# Patient Record
Sex: Female | Born: 1990 | Race: White | Hispanic: No | Marital: Married | Smoking: Never smoker
Health system: Southern US, Community
[De-identification: ages and names within clinical notes are randomized; demographics above are authoritative.]

## PROBLEM LIST (undated history)

## (undated) DIAGNOSIS — N189 Chronic kidney disease, unspecified: Secondary | ICD-10-CM

## (undated) DIAGNOSIS — Z9889 Other specified postprocedural states: Secondary | ICD-10-CM

## (undated) DIAGNOSIS — K8020 Calculus of gallbladder without cholecystitis without obstruction: Secondary | ICD-10-CM

## (undated) DIAGNOSIS — G540 Brachial plexus disorders: Secondary | ICD-10-CM

## (undated) DIAGNOSIS — Z87442 Personal history of urinary calculi: Secondary | ICD-10-CM

## (undated) DIAGNOSIS — R519 Headache, unspecified: Secondary | ICD-10-CM

## (undated) DIAGNOSIS — R112 Nausea with vomiting, unspecified: Secondary | ICD-10-CM

## (undated) HISTORY — DX: Calculus of gallbladder without cholecystitis without obstruction: K80.20

## (undated) HISTORY — DX: Headache, unspecified: R51.9

## (undated) HISTORY — PX: FOOT SURGERY: SHX648

## (undated) HISTORY — PX: WISDOM TOOTH EXTRACTION: SHX21

## (undated) HISTORY — DX: Chronic kidney disease, unspecified: N18.9

## (undated) HISTORY — PX: KIDNEY STONE SURGERY: SHX686

---

## 2014-04-03 DIAGNOSIS — Z8669 Personal history of other diseases of the nervous system and sense organs: Secondary | ICD-10-CM | POA: Insufficient documentation

## 2014-04-03 DIAGNOSIS — Z87442 Personal history of urinary calculi: Secondary | ICD-10-CM | POA: Insufficient documentation

## 2019-10-11 NOTE — L&D Delivery Note (Signed)
Operative Delivery Note At 10:27 AM a viable female was delivered via Vaginal, Vacuum Investment banker, operational).  Presentation: vertex; Position: Occiput,, Anterior; Station: +3.  Vacuum consent performed. D/w pt r/o cephalohematoma, laceraton, pop offs and shoulder dystocia with failure to deliver the fetal body resulting in oxygen deprivation. She voiced understanding and consented to vacuum assisted delivery.  Delivery of the head: 08/20/2020 10:26 AM First maneuver: 08/20/2020 10:26 AM, [place]ts Suprapubic Pressure Second maneuver: ,   Third maneuver: ,delivery of the anterior arm    Fourth maneuver: ,   Fifth maneuver: ,   Sixth maneuver: ,    Verbal consent: obtained from patient.  APGAR: 6, 9; weight pending  .   Placenta status complete and 3 vessel cord : , .   Cord:  with the following complications:None .  Cord pH: pending   Anesthesia: epidrual   Episiotomy: None Lacerations:  Second degree perineal and left periurethral  Suture Repair: 3.0 vicryl Est. Blood Loss (mL):  200 mL  Mom to postpartum.  Baby to Couplet care / Skin to Skin.  Gerald Leitz 08/20/2020, 10:58 AM

## 2020-01-15 LAB — OB RESULTS CONSOLE ABO/RH: RH Type: NEGATIVE

## 2020-01-15 LAB — OB RESULTS CONSOLE HIV ANTIBODY (ROUTINE TESTING): HIV: NONREACTIVE

## 2020-01-15 LAB — OB RESULTS CONSOLE HEPATITIS B SURFACE ANTIGEN: Hepatitis B Surface Ag: NEGATIVE

## 2020-01-15 LAB — OB RESULTS CONSOLE RUBELLA ANTIBODY, IGM: Rubella: IMMUNE

## 2020-01-15 LAB — OB RESULTS CONSOLE ANTIBODY SCREEN: Antibody Screen: NEGATIVE

## 2020-05-04 LAB — OB RESULTS CONSOLE RPR: RPR: NONREACTIVE

## 2020-05-10 ENCOUNTER — Inpatient Hospital Stay (HOSPITAL_COMMUNITY): Payer: Managed Care, Other (non HMO)

## 2020-05-10 ENCOUNTER — Encounter (HOSPITAL_COMMUNITY): Payer: Self-pay | Admitting: Obstetrics & Gynecology

## 2020-05-10 ENCOUNTER — Other Ambulatory Visit: Payer: Self-pay

## 2020-05-10 ENCOUNTER — Inpatient Hospital Stay (HOSPITAL_COMMUNITY)
Admission: AD | Admit: 2020-05-10 | Discharge: 2020-05-10 | Disposition: A | Discharge status: Home / Self Care | Payer: Managed Care, Other (non HMO) | Attending: Obstetrics & Gynecology | Admitting: Obstetrics & Gynecology

## 2020-05-10 DIAGNOSIS — R1011 Right upper quadrant pain: Secondary | ICD-10-CM | POA: Insufficient documentation

## 2020-05-10 DIAGNOSIS — Z3A24 24 weeks gestation of pregnancy: Secondary | ICD-10-CM | POA: Diagnosis not present

## 2020-05-10 DIAGNOSIS — K8020 Calculus of gallbladder without cholecystitis without obstruction: Secondary | ICD-10-CM

## 2020-05-10 DIAGNOSIS — O99612 Diseases of the digestive system complicating pregnancy, second trimester: Secondary | ICD-10-CM | POA: Diagnosis not present

## 2020-05-10 DIAGNOSIS — M549 Dorsalgia, unspecified: Secondary | ICD-10-CM | POA: Insufficient documentation

## 2020-05-10 DIAGNOSIS — O26892 Other specified pregnancy related conditions, second trimester: Secondary | ICD-10-CM | POA: Insufficient documentation

## 2020-05-10 DIAGNOSIS — R1010 Upper abdominal pain, unspecified: Secondary | ICD-10-CM

## 2020-05-10 DIAGNOSIS — O26612 Liver and biliary tract disorders in pregnancy, second trimester: Secondary | ICD-10-CM | POA: Diagnosis not present

## 2020-05-10 DIAGNOSIS — Z3492 Encounter for supervision of normal pregnancy, unspecified, second trimester: Secondary | ICD-10-CM

## 2020-05-10 HISTORY — DX: Brachial plexus disorders: G54.0

## 2020-05-10 LAB — CBC
HCT: 34.3 % — ABNORMAL LOW (ref 36.0–46.0)
Hemoglobin: 11.4 g/dL — ABNORMAL LOW (ref 12.0–15.0)
MCH: 29.8 pg (ref 26.0–34.0)
MCHC: 33.2 g/dL (ref 30.0–36.0)
MCV: 89.8 fL (ref 80.0–100.0)
Platelets: 219 10*3/uL (ref 150–400)
RBC: 3.82 MIL/uL — ABNORMAL LOW (ref 3.87–5.11)
RDW: 12.5 % (ref 11.5–15.5)
WBC: 10.9 10*3/uL — ABNORMAL HIGH (ref 4.0–10.5)
nRBC: 0 % (ref 0.0–0.2)

## 2020-05-10 LAB — COMPREHENSIVE METABOLIC PANEL
ALT: 91 U/L — ABNORMAL HIGH (ref 0–44)
AST: 133 U/L — ABNORMAL HIGH (ref 15–41)
Albumin: 3 g/dL — ABNORMAL LOW (ref 3.5–5.0)
Alkaline Phosphatase: 75 U/L (ref 38–126)
Anion gap: 11 (ref 5–15)
BUN: 5 mg/dL — ABNORMAL LOW (ref 6–20)
CO2: 22 mmol/L (ref 22–32)
Calcium: 8.5 mg/dL — ABNORMAL LOW (ref 8.9–10.3)
Chloride: 103 mmol/L (ref 98–111)
Creatinine, Ser: 0.7 mg/dL (ref 0.44–1.00)
GFR calc Af Amer: >60 mL/min (ref >60)
GFR calc non Af Amer: >60 mL/min (ref >60)
Glucose, Bld: 122 mg/dL — ABNORMAL HIGH (ref 70–99)
Potassium: 3.2 mmol/L — ABNORMAL LOW (ref 3.5–5.1)
Sodium: 136 mmol/L (ref 135–145)
Total Bilirubin: 0.8 mg/dL (ref 0.3–1.2)
Total Protein: 6.3 g/dL — ABNORMAL LOW (ref 6.5–8.1)

## 2020-05-10 LAB — URINALYSIS, ROUTINE W REFLEX MICROSCOPIC
Bilirubin Urine: NEGATIVE
Glucose, UA: NEGATIVE mg/dL
Hgb urine dipstick: NEGATIVE
Ketones, ur: 20 mg/dL — AB
Leukocytes,Ua: NEGATIVE
Nitrite: NEGATIVE
Protein, ur: NEGATIVE mg/dL
Specific Gravity, Urine: 1.014 (ref 1.005–1.030)
pH: 6 (ref 5.0–8.0)

## 2020-05-10 LAB — LIPASE, BLOOD: Lipase: 24 U/L (ref 11–51)

## 2020-05-10 LAB — AMYLASE: Amylase: 57 U/L (ref 28–100)

## 2020-05-10 MED ORDER — ONDANSETRON 4 MG PO TBDP
4.0000 mg | ORAL_TABLET | Freq: Once | ORAL | Status: AC
  Administered 2020-05-10: 4 mg via ORAL
  Filled 2020-05-10: qty 1

## 2020-05-10 MED ORDER — HYDROMORPHONE HCL 1 MG/ML IJ SOLN
1.0000 mg | INTRAMUSCULAR | Status: DC | PRN
  Administered 2020-05-10: 1 mg via INTRAMUSCULAR
  Filled 2020-05-10: qty 1

## 2020-05-10 MED ORDER — HYDROMORPHONE HCL 1 MG/ML IJ SOLN
1.0000 mg | Freq: Once | INTRAMUSCULAR | Status: AC
  Administered 2020-05-10: 1 mg via INTRAMUSCULAR
  Filled 2020-05-10: qty 1

## 2020-05-10 MED ORDER — PROMETHAZINE HCL 12.5 MG PO TABS
12.5000 mg | ORAL_TABLET | Freq: Four times a day (QID) | ORAL | 0 refills | Status: DC | PRN
Start: 2020-05-10 — End: 2020-07-18

## 2020-05-10 MED ORDER — HYDROMORPHONE HCL 2 MG PO TABS: 2.0000 mg | ORAL_TABLET | ORAL | 0 refills | Status: AC | PRN

## 2020-05-10 NOTE — Discharge Instructions (Signed)
Cholelithiasis  Cholelithiasis is also called "gallstones." It is a kind of gallbladder disease. The gallbladder is an organ that stores a liquid (bile) that helps you digest fat. Gallstones may not cause symptoms (may be silent gallstones) until they cause a blockage, and then they can cause pain (gallbladder attack). Follow these instructions at home:  Take over-the-counter and prescription medicines only as told by your doctor.  Stay at a healthy weight.  Eat healthy foods. This includes: ? Eating fewer fatty foods, like fried foods. ? Eating fewer refined carbs (refined carbohydrates). Refined carbs are breads and grains that are highly processed, like white bread and white rice. Instead, choose whole grains like whole-wheat bread and brown rice. ? Eating more fiber. Almonds, fresh fruit, and beans are healthy sources of fiber.  Keep all follow-up visits as told by your doctor. This is important. Contact a doctor if:  You have sudden pain in the upper right side of your belly (abdomen). Pain might spread to your right shoulder or your chest. This may be a sign of a gallbladder attack.  You feel sick to your stomach (are nauseous).  You throw up (vomit).  You have been diagnosed with gallstones that have no symptoms and you get: ? Belly pain. ? Discomfort, burning, or fullness in the upper part of your belly (indigestion). Get help right away if:  You have sudden pain in the upper right side of your belly, and it lasts for more than 2 hours.  You have belly pain that lasts for more than 5 hours.  You have a fever or chills.  You keep feeling sick to your stomach or you keep throwing up.  Your skin or the whites of your eyes turn yellow (jaundice).  You have dark-colored pee (urine).  You have light-colored poop (stool). Summary  Cholelithiasis is also called "gallstones."  The gallbladder is an organ that stores a liquid (bile) that helps you digest fat.  Silent  gallstones are gallstones that do not cause symptoms.  A gallbladder attack may cause sudden pain in the upper right side of your belly. Pain might spread to your right shoulder or your chest. If this happens, contact your doctor.  If you have sudden pain in the upper right side of your belly that lasts for more than 2 hours, get help right away. This information is not intended to replace advice given to you by your health care provider. Make sure you discuss any questions you have with your health care provider. Document Revised: 09/08/2017 Document Reviewed: 06/12/2016 Elsevier Patient Education  2020 [employer]  

## 2020-05-10 NOTE — MAU Provider Note (Signed)
History     CSN: [id number]  Arrival date and time: 05/10/20 2016   First Provider Initiated Contact with Patient 05/10/20 2101      Chief Complaint  Patient presents with  . Abdominal Pain  . Back Pain   HPI Lisa Benton is a 29 y.o. G3P0020 at [redacted]w[redacted]d who presents to MAU with chief complaint of sharp RUQ pain and concern for gallstones. Patient states her pain is constant and radiates to her right flank and down the right side of her abdomen. She also endorses one episode of vomiting on her way to MAU. She endorses strong family history of gallbladder removal due to recurrent painful gallstones. She denies all pregnancy-related concerns including vaginal bleeding, leaking of fluid, decreased fetal movement, fever, falls, or recent illness.   She receives prenatal care with [facility].  OB History    Gravida  3   Para      Term      Preterm      AB  2   Living        SAB  2   TAB      Ectopic      Multiple      Live Births              Past Medical History:  Diagnosis Date  . Thoracic outlet syndrome     Past Surgical History:  Procedure Laterality Date  . KIDNEY STONE SURGERY      History reviewed. No pertinent family history.  Social History   Tobacco Use  . Smoking status: Never Smoker  . Smokeless tobacco: Never Used  Substance Use Topics  . Alcohol use: Not Currently  . Drug use: Never    Allergies:  Allergies  Allergen Reactions  . Penicillins Anaphylaxis  . Shellfish Allergy Anaphylaxis and Rash    Medications Prior to Admission  Medication Sig Dispense Refill Last Dose  . doxylamine, Sleep, (UNISOM) 25 MG tablet Take 25 mg by mouth at bedtime as needed.   05/10/2020 at Unknown time  . METOCLOPRAMIDE HCL PO Take by mouth.   05/10/2020 at Unknown time    Review of Systems  Gastrointestinal: Positive for abdominal pain, nausea and vomiting.  Musculoskeletal: Positive for back pain.  All other systems reviewed and are  negative.  Physical Exam   Blood pressure (!) 97/61, pulse 78, temperature 98.5 F (36.9 C), temperature source Oral, resp. rate 20, height 5\' 5"  (1.651 m), weight 78.2 kg, SpO2 100 %.  Physical Exam Vitals and nursing note reviewed.  Constitutional:      Appearance: She is well-developed.  Cardiovascular:     Rate and Rhythm: Normal rate.  Abdominal:     Palpations: Abdomen is soft.     Tenderness: There is abdominal tenderness. There is guarding.  Skin:    General: Skin is warm and dry.     Capillary Refill: Capillary refill takes less than 2 seconds.  Neurological:     General: No focal deficit present.     Mental Status: She is alert.  Psychiatric:        Mood and Affect: Mood normal.        Behavior: Behavior normal.     MAU Course  Procedures  Patient Vitals for the past 24 hrs:  BP Temp Temp src Pulse Resp SpO2 Height Weight  05/10/20 2317 (!) 117/64 98.1 F (36.7 C) Oral 72 16 98 % -- --  05/10/20 2037 (!) 97/61 98.5 F (36.9 C)  Oral 78 20 100 % 5\' 5"  (1.651 m) 78.2 kg   Orders Placed This Encounter  Procedures  . ABDOMEN LIMITED RUQ  . Urinalysis, Routine w reflex microscopic  . CBC  . Comprehensive metabolic panel  . Amylase  . Lipase, blood   Results for orders placed or performed during the hospital encounter of 05/10/20 (from the past 24 hour(s))  CBC     Status: Abnormal   Collection Time: 05/10/20  8:54 PM  Result Value Ref Range   WBC 10.9 (H) 4.0 - 10.5 K/uL   RBC 3.82 (L) 3.87 - 5.11 MIL/uL   Hemoglobin 11.4 (L) 12.0 - 15.0 g/dL   HCT 07/10/20 (L) 36 - 46 %   MCV 89.8 80.0 - 100.0 fL   MCH 29.8 26.0 - 34.0 pg   MCHC 33.2 30.0 - 36.0 g/dL   RDW 83.3 82.5 - 05.3 %   Platelets 219 150 - 400 K/uL   nRBC 0.0 0.0 - 0.2 %  Comprehensive metabolic panel     Status: Abnormal   Collection Time: 05/10/20  8:54 PM  Result Value Ref Range   Sodium 136 135 - 145 mmol/L   Potassium 3.2 (L) 3.5 - 5.1 mmol/L   Chloride 103 98 - 111 mmol/L   CO2 22 22  - 32 mmol/L   Glucose, Bld 122 (H) 70 - 99 mg/dL   BUN 5 (L) 6 - 20 mg/dL   Creatinine, Ser 07/10/20 0.44 - 1.00 mg/dL   Calcium 8.5 (L) 8.9 - 10.3 mg/dL   Total Protein 6.3 (L) 6.5 - 8.1 g/dL   Albumin 3.0 (L) 3.5 - 5.0 g/dL   AST 7.34 (H) 15 - 41 U/L   ALT 91 (H) 0 - 44 U/L   Alkaline Phosphatase 75 38 - 126 U/L   Total Bilirubin 0.8 0.3 - 1.2 mg/dL   GFR calc non Af Amer >60 >60 mL/min   GFR calc Af Amer >60 >60 mL/min   Anion gap 11 5 - 15  Amylase     Status: None   Collection Time: 05/10/20  8:54 PM  Result Value Ref Range   Amylase 57 28 - 100 U/L  Lipase, blood     Status: None   Collection Time: 05/10/20  8:54 PM  Result Value Ref Range   Lipase 24 11 - 51 U/L  Urinalysis, Routine w reflex microscopic     Status: Abnormal   Collection Time: 05/10/20  9:00 PM  Result Value Ref Range   Color, Urine AMBER (A) YELLOW   APPearance CLOUDY (A) CLEAR   Specific Gravity, Urine 1.014 1.005 - 1.030   pH 6.0 5.0 - 8.0   Glucose, UA NEGATIVE NEGATIVE mg/dL   Hgb urine dipstick NEGATIVE NEGATIVE   Bilirubin Urine NEGATIVE NEGATIVE   Ketones, ur 20 (A) NEGATIVE mg/dL   Protein, ur NEGATIVE NEGATIVE mg/dL   Nitrite NEGATIVE NEGATIVE   Leukocytes,Ua NEGATIVE NEGATIVE    07/10/20 ABDOMEN LIMITED RUQ  Result Date: 05/10/2020 CLINICAL DATA:  Right upper quadrant pain. Nausea and vomiting. Pregnant patient at [redacted] weeks gestation. EXAM: ULTRASOUND ABDOMEN LIMITED RIGHT UPPER QUADRANT COMPARISON:  None. FINDINGS: Gallbladder: Physiologically distended containing intraluminal sludge and small stones. No gallbladder wall thickening or pericholecystic fluid. No sonographic Murphy sign noted by sonographer. Common bile duct: Diameter: 2 mm, normal. Liver: No focal lesion identified. Within normal limits in parenchymal echogenicity. Portal vein is patent on color Doppler imaging with normal direction of blood flow towards the  liver. Other: None. IMPRESSION: 1. Sludge and small stones in the gallbladder  without sonographic findings of acute cholecystitis. 2. No biliary dilatation. Electronically Signed   By: Narda Rutherford M.D.   On: 05/10/2020 22:18   Meds ordered this encounter  Medications  . HYDROmorphone (DILAUDID) injection 1 mg  . ondansetron (ZOFRAN-ODT) disintegrating tablet 4 mg  . HYDROmorphone (DILAUDID) injection 1 mg  . promethazine (PHENERGAN) 12.5 MG tablet    Sig: Take 1 tablet (12.5 mg total) by mouth every 6 (six) hours as needed for nausea or vomiting.    Dispense:  30 tablet    Refill:  0    Order Specific Question:   Supervising Provider    Answer:   Jaynie Collins A [[id number]]  . HYDROmorphone (DILAUDID) 2 MG tablet    Sig: Take 1 tablet (2 mg total) by mouth every 4 (four) hours as needed for up to 5 days for severe pain.    Dispense:  30 tablet    Refill:  0    Gallstones    Order Specific Question:   Supervising Provider    Answer:   Jaynie Collins A [[id number]]   Assessment and Plan  --29 y.o. G3P0020 at [redacted]w[redacted]d  --Reassuring fetal surveillance --Baseline 135, mod var, + 10 x 10 accels, no decels --Toco: quiet --Small non-obstructive gallstones, sludge --Care coordinated with Dr. Macon Large --Discharge home in stable condition  F/U: --Discuss outpatient surgical consult for gallstones --[facility]  Calvert Cantor, CNM 05/11/2020, 3:58 AM

## 2020-05-10 NOTE — MAU Note (Signed)
Pt here with RUQ pain that started around 2pm. States she has felt this pain 2 other times in the last 2 months. In the past, the pain goes away. Has a history of gallbladder issues. Pain is constant and radiates into back and RLQ. She reports nausea and one episode of vomiting. Denies vaginal bleeding, LOF, contractions. Good fetal movement.

## 2020-07-16 ENCOUNTER — Inpatient Hospital Stay (HOSPITAL_COMMUNITY)
Admission: AD | Admit: 2020-07-16 | Discharge: 2020-07-16 | Disposition: A | Discharge status: Home / Self Care | Payer: Managed Care, Other (non HMO) | Attending: Obstetrics and Gynecology | Admitting: Obstetrics and Gynecology

## 2020-07-16 ENCOUNTER — Encounter (HOSPITAL_COMMUNITY): Payer: Self-pay | Admitting: Obstetrics and Gynecology

## 2020-07-16 DIAGNOSIS — G43109 Migraine with aura, not intractable, without status migrainosus: Secondary | ICD-10-CM

## 2020-07-16 DIAGNOSIS — O4700 False labor before 37 completed weeks of gestation, unspecified trimester: Secondary | ICD-10-CM | POA: Diagnosis not present

## 2020-07-16 DIAGNOSIS — O26893 Other specified pregnancy related conditions, third trimester: Secondary | ICD-10-CM | POA: Diagnosis not present

## 2020-07-16 DIAGNOSIS — Z3A34 34 weeks gestation of pregnancy: Secondary | ICD-10-CM | POA: Insufficient documentation

## 2020-07-16 DIAGNOSIS — O4703 False labor before 37 completed weeks of gestation, third trimester: Secondary | ICD-10-CM | POA: Insufficient documentation

## 2020-07-16 DIAGNOSIS — Z79899 Other long term (current) drug therapy: Secondary | ICD-10-CM | POA: Insufficient documentation

## 2020-07-16 DIAGNOSIS — R42 Dizziness and giddiness: Secondary | ICD-10-CM | POA: Insufficient documentation

## 2020-07-16 LAB — COMPREHENSIVE METABOLIC PANEL
ALT: 17 U/L (ref 0–44)
AST: 18 U/L (ref 15–41)
Albumin: 2.6 g/dL — ABNORMAL LOW (ref 3.5–5.0)
Alkaline Phosphatase: 163 U/L — ABNORMAL HIGH (ref 38–126)
Anion gap: 11 (ref 5–15)
BUN: 6 mg/dL (ref 6–20)
CO2: 20 mmol/L — ABNORMAL LOW (ref 22–32)
Calcium: 8.4 mg/dL — ABNORMAL LOW (ref 8.9–10.3)
Chloride: 105 mmol/L (ref 98–111)
Creatinine, Ser: 0.7 mg/dL (ref 0.44–1.00)
GFR calc non Af Amer: >60 mL/min (ref >60)
Glucose, Bld: 192 mg/dL — ABNORMAL HIGH (ref 70–99)
Potassium: 3.6 mmol/L (ref 3.5–5.1)
Sodium: 136 mmol/L (ref 135–145)
Total Bilirubin: 0.3 mg/dL (ref 0.3–1.2)
Total Protein: 6 g/dL — ABNORMAL LOW (ref 6.5–8.1)

## 2020-07-16 LAB — CBC
HCT: 34.1 % — ABNORMAL LOW (ref 36.0–46.0)
Hemoglobin: 11 g/dL — ABNORMAL LOW (ref 12.0–15.0)
MCH: 28.4 pg (ref 26.0–34.0)
MCHC: 32.3 g/dL (ref 30.0–36.0)
MCV: 88.1 fL (ref 80.0–100.0)
Platelets: 174 10*3/uL (ref 150–400)
RBC: 3.87 MIL/uL (ref 3.87–5.11)
RDW: 13.1 % (ref 11.5–15.5)
WBC: 8.9 10*3/uL (ref 4.0–10.5)
nRBC: 0 % (ref 0.0–0.2)

## 2020-07-16 LAB — PROTEIN / CREATININE RATIO, URINE
Creatinine, Urine: 41.37 mg/dL
Protein Creatinine Ratio: 0.19 mg/mg{Cre} — ABNORMAL HIGH (ref 0.00–0.15)
Total Protein, Urine: 8 mg/dL

## 2020-07-16 MED ORDER — NIFEDIPINE 10 MG PO CAPS
10.0000 mg | ORAL_CAPSULE | ORAL | Status: DC | PRN
  Administered 2020-07-16 (×2): 10 mg via ORAL
  Filled 2020-07-16 (×2): qty 1

## 2020-07-16 NOTE — MAU Note (Addendum)
Was sitting at dinner at 1930 and suddenly felt faint. Heard loud noise in ear and vision black and fuzzy. Just sat there and it passed. Afterward had pressure behind eyes and fingers tingly more L hand and L eye. Hx h/a as teen but better since then. Denies LOF or VB. Now has a headache rates at 3. Labs drawn in Triage

## 2020-07-16 NOTE — MAU Provider Note (Signed)
Chief Complaint:  Headache and Dizziness   First Provider Initiated Contact with Patient 07/16/20 2133     HPI: Lisa Benton is a 29 y.o. G3P0020 at 43w1dwho presents to maternity admissions reporting an episode at dinner with headache and short time of "tunnel vision" which resolved prior to arrival   States headache is almost gone now.  Was worried it was hypertension so she came in for evaluation . She reports good fetal movement, denies LOF, vaginal bleeding, vaginal itching/burning, urinary symptoms, dizziness, n/v, diarrhea, constipation or fever/chills.  She denies visual changes or RUQ abdominal pain.  Headache  This is a new problem. The current episode started today. The problem has been resolved. Associated symptoms include blurred vision ("tunnel vision") and dizziness. Pertinent negatives include no abdominal pain, back pain, coughing, nausea, photophobia, sinus pressure, vomiting or weakness. Nothing aggravates the symptoms. She has tried nothing for the symptoms.  Dizziness This is a new problem. The current episode started today. The problem has been resolved. Associated symptoms include headaches. Pertinent negatives include no abdominal pain, congestion, coughing, nausea, vomiting or weakness. Nothing aggravates the symptoms. She has tried nothing for the symptoms.    RN Note: Was sitting at dinner at 1930 and suddenly felt faint. Heard loud noise in ear and vision black and fuzzy. Just sat there and it passed. Afterward had pressure behind eyes and fingers tingly more L hand and L eye. Hx h/a as teen but better since then. Denies LOF or VB. Now has a headache rates at 3. Labs drawn in Triage  Past Medical History: Past Medical History:  Diagnosis Date  . Thoracic outlet syndrome     Past obstetric history: OB History  Gravida Para Term Preterm AB Living  3       2    SAB TAB Ectopic Multiple Live Births  2            # Outcome Date GA Lbr Len/2nd Weight Sex Delivery  Anes PTL Lv  3 Current           2 SAB 2020          1 SAB 2019            Past Surgical History: Past Surgical History:  Procedure Laterality Date  . KIDNEY STONE SURGERY      Family History: Family History  Problem Relation Age of Onset  . Diabetes Father     Social History: Social History   Tobacco Use  . Smoking status: Never Smoker  . Smokeless tobacco: Never Used  Substance Use Topics  . Alcohol use: Not Currently  . Drug use: Never    Allergies:  Allergies  Allergen Reactions  . Penicillins Anaphylaxis  . Shellfish Allergy Anaphylaxis and Rash    Meds:  Medications Prior to Admission  Medication Sig Dispense Refill Last Dose  . Doxylamine-Pyridoxine (DICLEGIS PO) Take by mouth.   07/16/2020 at Unknown time  . famotidine (PEPCID) 10 MG tablet Take 10 mg by mouth 2 (two) times daily.   07/15/2020 at Unknown time  . Prenatal Vit-Fe Fumarate-FA (PRENATAL MULTIVITAMIN) TABS tablet Take 1 tablet by mouth daily at 12 noon.   07/16/2020 at Unknown time  . doxylamine, Sleep, (UNISOM) 25 MG tablet Take 25 mg by mouth at bedtime as needed.     Marland Kitchen METOCLOPRAMIDE HCL PO Take by mouth.     . promethazine (PHENERGAN) 12.5 MG tablet Take 1 tablet (12.5 mg total) by mouth every 6 (six) hours  as needed for nausea or vomiting. 30 tablet 0     I have reviewed patient's Past Medical Hx, Surgical Hx, Family Hx, Social Hx, medications and allergies.   ROS:  Review of Systems  HENT: Negative for congestion and sinus pressure.   Eyes: Positive for blurred vision ("tunnel vision"). Negative for photophobia.  Respiratory: Negative for cough.   Gastrointestinal: Negative for abdominal pain, nausea and vomiting.  Musculoskeletal: Negative for back pain.  Neurological: Positive for dizziness and headaches. Negative for weakness.   Other systems negative  Physical Exam   Patient Vitals for the past 24 hrs:  BP Temp Pulse Resp SpO2 Height Weight  07/16/20 2122 116/69 -- -- 17 99 %  -- --  07/16/20 2029 134/79 -- (!) 108 -- -- -- --  07/16/20 2027 -- 98.8 F (37.1 C) -- 18 -- 5\' 5"  (1.651 m) 83.9 kg   Vitals:   07/16/20 2122 07/16/20 2208 07/16/20 2229 07/16/20 2252  BP: 116/69 110/69 107/62 114/74  Pulse:    97  Resp: 17   15  Temp:      SpO2: 99%     Weight:      Height:        Constitutional: Well-developed, well-nourished female in no acute distress.  Cardiovascular: normal rate and rhythm Respiratory: normal effort, clear to auscultation bilaterally GI: Abd soft, non-tender, gravid appropriate for gestational age.   No rebound or guarding. MS: Extremities nontender, no edema, normal ROM Neurologic: Alert and oriented x 4.  GU: Neg CVAT.  PELVIC EXAM: Dilation: Closed Effacement (%): 20 Station: Ballotable Exam by:: [email or web address], CNM    FHT:  Baseline 140 , moderate variability, accelerations present, no decelerations Contractions: q 6 mins Irregular  Patient only feels tightening.    Labs: Results for orders placed or performed during the hospital encounter of 07/16/20 (from the past 24 hour(s))  CBC     Status: Abnormal   Collection Time: 07/16/20  8:31 PM  Result Value Ref Range   WBC 8.9 4.0 - 10.5 K/uL   RBC 3.87 3.87 - 5.11 MIL/uL   Hemoglobin 11.0 (L) 12.0 - 15.0 g/dL   HCT 09/15/20 (L) 36 - 46 %   MCV 88.1 80.0 - 100.0 fL   MCH 28.4 26.0 - 34.0 pg   MCHC 32.3 30.0 - 36.0 g/dL   RDW 93.7 34.2 - 87.6 %   Platelets 174 150 - 400 K/uL   nRBC 0.0 0.0 - 0.2 %  Comprehensive metabolic panel     Status: Abnormal   Collection Time: 07/16/20  8:31 PM  Result Value Ref Range   Sodium 136 135 - 145 mmol/L   Potassium 3.6 3.5 - 5.1 mmol/L   Chloride 105 98 - 111 mmol/L   CO2 20 (L) 22 - 32 mmol/L   Glucose, Bld 192 (H) 70 - 99 mg/dL   BUN 6 6 - 20 mg/dL   Creatinine, Ser 09/15/20 0.44 - 1.00 mg/dL   Calcium 8.4 (L) 8.9 - 10.3 mg/dL   Total Protein 6.0 (L) 6.5 - 8.1 g/dL   Albumin 2.6 (L) 3.5 - 5.0 g/dL   AST 18 15 - 41 U/L   ALT 17 0 - 44  U/L   Alkaline Phosphatase 163 (H) 38 - 126 U/L   Total Bilirubin 0.3 0.3 - 1.2 mg/dL   GFR calc non Af Amer >60 >60 mL/min   Anion gap 11 5 - 15  Protein / creatinine ratio, urine  Status: Abnormal   Collection Time: 07/16/20  8:33 PM  Result Value Ref Range   Creatinine, Urine 41.37 mg/dL   Total Protein, Urine 8 mg/dL   Protein Creatinine Ratio 0.19 (H) 0.00 - 0.15 mg/mg[Cre]     Imaging:  No results found.  MAU Course/MDM: I have ordered labs and reviewed results. Labs are normal    NST reviewed, noted to be having contractions but does not have any pain with them.   Treatments in MAU included Procardia series..  After two doses contractions mostly stopped.   Assessment: Single IUP at [redacted]w[redacted]d Headache, likely migraine, now resolved No hypertension Preterm contractions with no change in cervix  Plan: Discharge home Preterm Labor precautions and fetal kick counts Follow up in Office for prenatal visits, recommend giving them a call in am  Encouraged to return here or to other Urgent Care/ED if she develops worsening of symptoms, increase in pain, fever, or other concerning symptoms.  Pt stable at time of discharge.  Wynelle Bourgeois CNM, MSN Certified Nurse-Midwife 07/16/2020 9:33 PM

## 2020-07-16 NOTE — Discharge Instructions (Signed)
Migraine Headache A migraine headache is an intense, throbbing pain on one side or both sides of the head. Migraine headaches may also cause other symptoms, such as nausea, vomiting, and sensitivity to light and noise. A migraine headache can last from 4 hours to 3 days. Talk with your doctor about what things may bring on (trigger) your migraine headaches. What are the causes? The exact cause of this condition is not known. However, a migraine may be caused when nerves in the brain become irritated and release chemicals that cause inflammation of blood vessels. This inflammation causes pain. This condition may be triggered or caused by:  Drinking alcohol.  Smoking.  Taking medicines, such as: ? Medicine used to treat chest pain (nitroglycerin). ? Birth control pills. ? Estrogen. ? Certain blood pressure medicines.  Eating or drinking products that contain nitrates, glutamate, aspartame, or tyramine. Aged cheeses, chocolate, or caffeine may also be triggers.  Doing physical activity. Other things that may trigger a migraine headache include:  Menstruation.  Pregnancy.  Hunger.  Stress.  Lack of sleep or too much sleep.  Weather changes.  Fatigue. What increases the risk? The following factors may make you more likely to experience migraine headaches:  Being a certain age. This condition is more common in people who are 26-74 years old.  Being female.  Having a family history of migraine headaches.  Being Caucasian.  Having a mental health condition, such as depression or anxiety.  Being obese. What are the signs or symptoms? The main symptom of this condition is pulsating or throbbing pain. This pain may:  Happen in any area of the head, such as on one side or both sides.  Interfere with daily activities.  Get worse with physical activity.  Get worse with exposure to bright lights or loud noises. Other symptoms may  include:  Nausea.  Vomiting.  Dizziness.  General sensitivity to bright lights, loud noises, or smells. Before you get a migraine headache, you may get warning signs (an aura). An aura may include:  Seeing flashing lights or having blind spots.  Seeing bright spots, halos, or zigzag lines.  Having tunnel vision or blurred vision.  Having numbness or a tingling feeling.  Having trouble talking.  Having muscle weakness. Some people have symptoms after a migraine headache (postdromal phase), such as:  Feeling tired.  Difficulty concentrating. How is this diagnosed? A migraine headache can be diagnosed based on:  Your symptoms.  A physical exam.  Tests, such as: ? CT scan or an MRI of the head. These imaging tests can help rule out other causes of headaches. ? Taking fluid from the spine (lumbar puncture) and analyzing it (cerebrospinal fluid analysis, or CSF analysis). How is this treated? This condition may be treated with medicines that:  Relieve pain.  Relieve nausea.  Prevent migraine headaches. Treatment for this condition may also include:  Acupuncture.  Lifestyle changes like avoiding foods that trigger migraine headaches.  Biofeedback.  Cognitive behavioral therapy. Follow these instructions at home: Medicines  Take over-the-counter and prescription medicines only as told by your health care provider.  Ask your health care provider if the medicine prescribed to you: ? Requires you to avoid driving or using heavy machinery. ? Can cause constipation. You may need to take these actions to prevent or treat constipation:  Drink enough fluid to keep your urine pale yellow.  Take over-the-counter or prescription medicines.  Eat foods that are high in fiber, such as beans, whole grains, and  fresh fruits and vegetables.  Limit foods that are high in fat and processed sugars, such as fried or sweet foods. Lifestyle  Do not drink alcohol.  Do not  use any products that contain nicotine or tobacco, such as cigarettes, e-cigarettes, and chewing tobacco. If you need help quitting, ask your health care provider.  Get at least 8 hours of sleep every night.  Find ways to manage stress, such as meditation, deep breathing, or yoga. General instructions      Keep a journal to find out what may trigger your migraine headaches. For example, write down: ? What you eat and drink. ? How much sleep you get. ? Any change to your diet or medicines.  If you have a migraine headache: ? Avoid things that make your symptoms worse, such as bright lights. ? It may help to lie down in a dark, quiet room. ? Do not drive or use heavy machinery. ? Ask your health care provider what activities are safe for you while you are experiencing symptoms.  Keep all follow-up visits as told by your health care provider. This is important. Contact a health care provider if:  You develop symptoms that are different or more severe than your usual migraine headache symptoms.  You have more than 15 headache days in one month. Get help right away if:  Your migraine headache becomes severe.  Your migraine headache lasts longer than 72 hours.  You have a fever.  You have a stiff neck.  You have vision loss.  Your muscles feel weak or like you cannot control them.  You start to lose your balance often.  You have trouble walking.  You faint.  You have a seizure. Summary  A migraine headache is an intense, throbbing pain on one side or both sides of the head. Migraines may also cause other symptoms, such as nausea, vomiting, and sensitivity to light and noise.  This condition may be treated with medicines and lifestyle changes. You may also need to avoid certain things that trigger a migraine headache.  Keep a journal to find out what may trigger your migraine headaches.  Contact your health care provider if you have more than 15 headache days in a  month or you develop symptoms that are different or more severe than your usual migraine headache symptoms. This information is not intended to replace advice given to you by your health care provider. Make sure you discuss any questions you have with your health care provider. Document Revised: 01/18/2019 Document Reviewed: 11/08/2018 Elsevier Patient Education  2020 [employer]   [employer] of the uterus can occur throughout pregnancy, but they are not always a sign that you are in labor. You may have practice contractions called Braxton Hicks contractions. These false labor contractions are sometimes confused with true labor. What are Deberah Pelton contractions? Braxton Hicks contractions are tightening movements that occur in the muscles of the uterus before labor. Unlike true labor contractions, these contractions do not result in opening (dilation) and thinning of the cervix. Toward the end of pregnancy (32-34 weeks), Braxton Hicks contractions can happen more often and may become stronger. These contractions are sometimes difficult to tell apart from true labor because they can be very uncomfortable. You should not feel embarrassed if you go to the hospital with false labor. Sometimes, the only way to tell if you are in true labor is for your health care provider to look for changes in the cervix. The health  care provider will do a physical exam and may monitor your contractions. If you are not in true labor, the exam should show that your cervix is not dilating and your water has not broken. If there are no other health problems associated with your pregnancy, it is completely safe for you to be sent home with false labor. You may continue to have Braxton Hicks contractions until you go into true labor. How to tell the difference between true labor and false labor True labor  Contractions last 30-70 seconds.  Contractions become very regular.  Discomfort is  usually felt in the top of the uterus, and it spreads to the lower abdomen and low back.  Contractions do not go away with walking.  Contractions usually become more intense and increase in frequency.  The cervix dilates and gets thinner. False labor  Contractions are usually shorter and not as strong as true labor contractions.  Contractions are usually irregular.  Contractions are often felt in the front of the lower abdomen and in the groin.  Contractions may go away when you walk around or change positions while lying down.  Contractions get weaker and are shorter-lasting as time goes on.  The cervix usually does not dilate or become thin. Follow these instructions at home:   Take over-the-counter and prescription medicines only as told by your health care provider.  Keep up with your usual exercises and follow other instructions from your health care provider.  Eat and drink lightly if you think you are going into labor.  If Braxton Hicks contractions are making you uncomfortable: ? Change your position from lying down or resting to walking, or change from walking to resting. ? Sit and rest in a tub of warm water. ? Drink enough fluid to keep your urine pale yellow. Dehydration may cause these contractions. ? Do slow and deep breathing several times an hour.  Keep all follow-up prenatal visits as told by your health care provider. This is important. Contact a health care provider if:  You have a fever.  You have continuous pain in your abdomen. Get help right away if:  Your contractions become stronger, more regular, and closer together.  You have fluid leaking or gushing from your vagina.  You pass blood-tinged mucus (bloody show).  You have bleeding from your vagina.  You have low back pain that you never had before.  You feel your babys head pushing down and causing pelvic pressure.  Your baby is not moving inside you as much as it used  to. Summary  Contractions that occur before labor are called Braxton Hicks contractions, false labor, or practice contractions.  Braxton Hicks contractions are usually shorter, weaker, farther apart, and less regular than true labor contractions. True labor contractions usually become progressively stronger and regular, and they become more frequent.  Manage discomfort from [facility] contractions by changing position, resting in a warm bath, drinking plenty of water, or practicing deep breathing. This information is not intended to replace advice given to you by your health care provider. Make sure you discuss any questions you have with your health care provider. Document Revised: 09/08/2017 Document Reviewed: 02/09/2017 Elsevier Patient Education  2020 [employer].

## 2020-07-17 DIAGNOSIS — O4700 False labor before 37 completed weeks of gestation, unspecified trimester: Secondary | ICD-10-CM

## 2020-07-17 DIAGNOSIS — Z3A34 34 weeks gestation of pregnancy: Secondary | ICD-10-CM | POA: Diagnosis not present

## 2020-07-17 DIAGNOSIS — R1011 Right upper quadrant pain: Secondary | ICD-10-CM | POA: Diagnosis not present

## 2020-07-17 DIAGNOSIS — O26893 Other specified pregnancy related conditions, third trimester: Secondary | ICD-10-CM | POA: Diagnosis not present

## 2020-07-17 DIAGNOSIS — O26613 Liver and biliary tract disorders in pregnancy, third trimester: Secondary | ICD-10-CM | POA: Diagnosis not present

## 2020-07-17 DIAGNOSIS — K8020 Calculus of gallbladder without cholecystitis without obstruction: Secondary | ICD-10-CM | POA: Diagnosis not present

## 2020-07-18 ENCOUNTER — Inpatient Hospital Stay (HOSPITAL_COMMUNITY): Payer: Managed Care, Other (non HMO)

## 2020-07-18 ENCOUNTER — Inpatient Hospital Stay (HOSPITAL_COMMUNITY)
Admission: AD | Admit: 2020-07-18 | Discharge: 2020-07-18 | Disposition: A | Discharge status: Home / Self Care | Payer: Managed Care, Other (non HMO) | Attending: Obstetrics and Gynecology | Admitting: Obstetrics and Gynecology

## 2020-07-18 ENCOUNTER — Other Ambulatory Visit: Payer: Self-pay

## 2020-07-18 ENCOUNTER — Encounter (HOSPITAL_COMMUNITY): Payer: Self-pay | Admitting: Obstetrics & Gynecology

## 2020-07-18 DIAGNOSIS — O4700 False labor before 37 completed weeks of gestation, unspecified trimester: Secondary | ICD-10-CM | POA: Diagnosis not present

## 2020-07-18 DIAGNOSIS — O26893 Other specified pregnancy related conditions, third trimester: Secondary | ICD-10-CM | POA: Diagnosis not present

## 2020-07-18 DIAGNOSIS — K8020 Calculus of gallbladder without cholecystitis without obstruction: Secondary | ICD-10-CM | POA: Diagnosis not present

## 2020-07-18 DIAGNOSIS — Z3A34 34 weeks gestation of pregnancy: Secondary | ICD-10-CM | POA: Diagnosis not present

## 2020-07-18 DIAGNOSIS — O26613 Liver and biliary tract disorders in pregnancy, third trimester: Secondary | ICD-10-CM | POA: Diagnosis present

## 2020-07-18 DIAGNOSIS — R1011 Right upper quadrant pain: Secondary | ICD-10-CM | POA: Diagnosis not present

## 2020-07-18 LAB — CBC WITH DIFFERENTIAL/PLATELET
Abs Immature Granulocytes: 0.06 10*3/uL (ref 0.00–0.07)
Basophils Absolute: 0 10*3/uL (ref 0.0–0.1)
Basophils Relative: 0 %
Eosinophils Absolute: 0 10*3/uL (ref 0.0–0.5)
Eosinophils Relative: 0 %
HCT: 32.8 % — ABNORMAL LOW (ref 36.0–46.0)
Hemoglobin: 10.8 g/dL — ABNORMAL LOW (ref 12.0–15.0)
Immature Granulocytes: 1 %
Lymphocytes Relative: 15 %
Lymphs Abs: 1.2 10*3/uL (ref 0.7–4.0)
MCH: 29.1 pg (ref 26.0–34.0)
MCHC: 32.9 g/dL (ref 30.0–36.0)
MCV: 88.4 fL (ref 80.0–100.0)
Monocytes Absolute: 0.7 10*3/uL (ref 0.1–1.0)
Monocytes Relative: 8 %
Neutro Abs: 6.2 10*3/uL (ref 1.7–7.7)
Neutrophils Relative %: 76 %
Platelets: 180 10*3/uL (ref 150–400)
RBC: 3.71 MIL/uL — ABNORMAL LOW (ref 3.87–5.11)
RDW: 13.2 % (ref 11.5–15.5)
WBC: 8.2 10*3/uL (ref 4.0–10.5)
nRBC: 0 % (ref 0.0–0.2)

## 2020-07-18 LAB — COMPREHENSIVE METABOLIC PANEL
ALT: 65 U/L — ABNORMAL HIGH (ref 0–44)
AST: 108 U/L — ABNORMAL HIGH (ref 15–41)
Albumin: 2.4 g/dL — ABNORMAL LOW (ref 3.5–5.0)
Alkaline Phosphatase: 162 U/L — ABNORMAL HIGH (ref 38–126)
Anion gap: 11 (ref 5–15)
BUN: 6 mg/dL (ref 6–20)
CO2: 19 mmol/L — ABNORMAL LOW (ref 22–32)
Calcium: 8.2 mg/dL — ABNORMAL LOW (ref 8.9–10.3)
Chloride: 107 mmol/L (ref 98–111)
Creatinine, Ser: 0.59 mg/dL (ref 0.44–1.00)
GFR, Estimated: >60 mL/min (ref >60)
Glucose, Bld: 120 mg/dL — ABNORMAL HIGH (ref 70–99)
Potassium: 4.1 mmol/L (ref 3.5–5.1)
Sodium: 137 mmol/L (ref 135–145)
Total Bilirubin: 1.3 mg/dL — ABNORMAL HIGH (ref 0.3–1.2)
Total Protein: 5.7 g/dL — ABNORMAL LOW (ref 6.5–8.1)

## 2020-07-18 LAB — URINALYSIS, ROUTINE W REFLEX MICROSCOPIC
Glucose, UA: >=500 mg/dL — AB
Hgb urine dipstick: NEGATIVE
Ketones, ur: 5 mg/dL — AB
Nitrite: NEGATIVE
Protein, ur: 30 mg/dL — AB
Specific Gravity, Urine: 1.016 (ref 1.005–1.030)
pH: 6 (ref 5.0–8.0)

## 2020-07-18 LAB — AMYLASE: Amylase: 58 U/L (ref 28–100)

## 2020-07-18 LAB — LIPASE, BLOOD: Lipase: 25 U/L (ref 11–51)

## 2020-07-18 MED ORDER — SODIUM CHLORIDE 0.9 % IV SOLN
8.0000 mg | Freq: Once | INTRAVENOUS | Status: DC
  Filled 2020-07-18: qty 4

## 2020-07-18 MED ORDER — PROMETHAZINE HCL 25 MG/ML IJ SOLN
25.0000 mg | Freq: Once | INTRAMUSCULAR | Status: AC
  Administered 2020-07-18: 25 mg via INTRAMUSCULAR
  Filled 2020-07-18: qty 1

## 2020-07-18 MED ORDER — LACTATED RINGERS IV BOLUS: 1000.0000 mL | Freq: Once | INTRAVENOUS | Status: DC

## 2020-07-18 MED ORDER — PROMETHAZINE HCL 25 MG PO TABS: 25.0000 mg | ORAL_TABLET | Freq: Three times a day (TID) | ORAL | 0 refills | Status: DC | PRN

## 2020-07-18 MED ORDER — PANTOPRAZOLE SODIUM 40 MG IV SOLR: 40.0000 mg | Freq: Once | INTRAVENOUS | Status: DC

## 2020-07-18 NOTE — Discharge Instructions (Signed)
Gallbladder Eating Plan If you have a gallbladder condition, you may have trouble digesting fats. Eating a low-fat diet can help reduce your symptoms, and may be helpful before and after having surgery to remove your gallbladder (cholecystectomy). Your health care provider may recommend that you work with a diet and nutrition specialist (dietitian) to help you reduce the amount of fat in your diet. What are tips for following this plan? General guidelines  Limit your fat intake to less than 30% of your total daily calories. If you eat around 1,800 calories each day, this is less than 60 grams (g) of fat per day.  Fat is an important part of a healthy diet. Eating a low-fat diet can make it hard to maintain a healthy body weight. Ask your dietitian how much fat, calories, and other nutrients you need each day.  Eat small, frequent meals throughout the day instead of three large meals.  Drink at least 8-10 cups of fluid a day. Drink enough fluid to keep your urine clear or pale yellow.  Limit alcohol intake to no more than 1 drink a day for nonpregnant women and 2 drinks a day for men. One drink equals 12 oz of beer, 5 oz of wine, or 1 oz of hard liquor. Reading food labels  Check Nutrition Facts on food labels for the amount of fat per serving. Choose foods with less than 3 grams of fat per serving. Shopping  Choose nonfat and low-fat healthy foods. Look for the words "nonfat," "low fat," or "fat free."  Avoid buying processed or prepackaged foods. Cooking  Cook using low-fat methods, such as baking, broiling, grilling, or boiling.  Cook with small amounts of healthy fats, such as olive oil, grapeseed oil, canola oil, or sunflower oil. What foods are recommended?   All fresh, frozen, or canned fruits and vegetables.  Whole grains.  Low-fat or non-fat (skim) milk and yogurt.  Lean meat, skinless poultry, fish, eggs, and beans.  Low-fat protein supplement powders or  drinks.  Spices and herbs. What foods are not recommended?  High-fat foods. These include baked goods, fast food, fatty cuts of meat, ice cream, french toast, sweet rolls, pizza, cheese bread, foods covered with butter, creamy sauces, or cheese.  Fried foods. These include french fries, tempura, battered fish, breaded chicken, fried breads, and sweets.  Foods with strong odors.  Foods that cause bloating and gas. Summary  A low-fat diet can be helpful if you have a gallbladder condition, or before and after gallbladder surgery.  Limit your fat intake to less than 30% of your total daily calories. This is about 60 g of fat if you eat 1,800 calories each day.  Eat small, frequent meals throughout the day instead of three large meals. This information is not intended to replace advice given to you by your health care provider. Make sure you discuss any questions you have with your health care provider. Document Revised: 01/17/2019 Document Reviewed: 11/03/2016 Elsevier Patient Education  2020 Else[employer] Inc.   Cholelithiasis  Cholelithiasis is a form of gallbladder disease in which gallstones form in the gallbladder. The gallbladder is an organ that stores bile. Bile is made in the liver, and it helps to digest fats. Gallstones begin as small crystals and slowly grow into stones. They may cause no symptoms until the gallbladder tightens (contracts) and a gallstone is blocking the duct (gallbladder attack), which can cause pain. Cholelithiasis is also referred to as gallstones. There are two main types of gallstones:    Cholesterol stones. These are made of hardened cholesterol and are usually yellow-green in color. They are the most common type of gallstone. Cholesterol is a white, waxy, fat-like substance that is made in the liver.  Pigment stones. These are dark in color and are made of a red-yellow substance that forms when hemoglobin from red blood cells breaks down (bilirubin). What  are the causes? This condition may be caused by an imbalance in the substances that bile is made of. This can happen if the bile:  Has too much bilirubin.  Has too much cholesterol.  Does not have enough bile salts. These salts help the body absorb and digest fats. In some cases, this condition can also be caused by the gallbladder not emptying completely or often enough. What increases the risk? The following factors may make you more likely to develop this condition:  Being female.  Having multiple pregnancies. Health care providers sometimes advise removing diseased gallbladders before future pregnancies.  Eating a diet that is heavy in fried foods, fat, and refined carbohydrates, like white bread and white rice.  Being obese.  Being older than age 40.  Prolonged use of medicines that contain female hormones (estrogen).  Having diabetes mellitus.  Rapidly losing weight.  Having a family history of gallstones.  Being of American Indian or [place] descent.  Having an intestinal disease such as Crohn disease.  Having metabolic syndrome.  Having cirrhosis.  Having severe types of anemia such as sickle cell anemia. What are the signs or symptoms? In most cases, there are no symptoms. These are known as silent gallstones. If a gallstone blocks the bile ducts, it can cause a gallbladder attack. The main symptom of a gallbladder attack is sudden pain in the upper right abdomen. The pain usually comes at night or after eating a large meal. The pain can last for one or several hours and can spread to the right shoulder or chest. If the bile duct is blocked for more than a few hours, it can cause infection or inflammation of the gallbladder, liver, or pancreas, which may cause:  Nausea.  Vomiting.  Abdominal pain that lasts for 5 hours or more.  Fever or chills.  Yellowing of the skin or the whites of the eyes (jaundice).  Dark urine.  Light-colored stools. How is this  diagnosed? This condition may be diagnosed based on:  A physical exam.  Your medical history.  An ultrasound of your gallbladder.  CT scan.  MRI.  Blood tests to check for signs of infection or inflammation.  A scan of your gallbladder and bile ducts (biliary system) using nonharmful radioactive material and special cameras that can see the radioactive material (cholescintigram). This test checks to see how your gallbladder contracts and whether bile ducts are blocked.  Inserting a small tube with a camera on the end (endoscope) through your mouth to inspect bile ducts and check for blockages (endoscopic retrograde cholangiopancreatogram). How is this treated? Treatment for gallstones depends on the severity of the condition. Silent gallstones do not need treatment. If the gallstones cause a gallbladder attack or other symptoms, treatment may be required. Options for treatment include:  Surgery to remove the gallbladder (cholecystectomy). This is the most common treatment.  Medicines to dissolve gallstones. These are most effective at treating small gallstones. You may need to take medicines for up to 6-12 months.  Shock wave treatment (extracorporeal biliary lithotripsy). In this treatment, an ultrasound machine sends shock waves to the gallbladder to break gallstones   into smaller pieces. These pieces can then be passed into the intestines or be dissolved by medicine. This is rarely used.  Removing gallstones through endoscopic retrograde cholangiopancreatogram. A small basket can be attached to the endoscope and used to capture and remove gallstones. Follow these instructions at home:  Take over-the-counter and prescription medicines only as told by your health care provider.  Maintain a healthy weight and follow a healthy diet. This includes: ? Reducing fatty foods, such as fried food. ? Reducing refined carbohydrates, like white bread and white rice. ? Increasing fiber. Aim for  foods like almonds, fruit, and beans.  Keep all follow-up visits as told by your health care provider. This is important. Contact a health care provider if:  You think you have had a gallbladder attack.  You have been diagnosed with silent gallstones and you develop abdominal pain or indigestion. Get help right away if:  You have pain from a gallbladder attack that lasts for more than 2 hours.  You have abdominal pain that lasts for more than 5 hours.  You have a fever or chills.  You have persistent nausea and vomiting.  You develop jaundice.  You have dark urine or light-colored stools. Summary  Cholelithiasis (also called gallstones) is a form of gallbladder disease in which gallstones form in the gallbladder.  This condition is caused by an imbalance in the substances that make up bile. This can happen if the bile has too much cholesterol, too much bilirubin, or not enough bile salts.  You are more likely to develop this condition if you are female, pregnant, using medicines with estrogen, obese, older than age 40, or have a family history of gallstones. You may also develop gallstones if you have diabetes, an intestinal disease, cirrhosis, or metabolic syndrome.  Treatment for gallstones depends on the severity of the condition. Silent gallstones do not need treatment.  If gallstones cause a gallbladder attack or other symptoms, treatment may be needed. The most common treatment is surgery to remove the gallbladder. This information is not intended to replace advice given to you by your health care provider. Make sure you discuss any questions you have with your health care provider. Document Revised: 09/08/2017 Document Reviewed: 06/12/2016 Elsevier Patient Education  2020 [employer] Inc.  

## 2020-07-18 NOTE — MAU Provider Note (Addendum)
History     CSN: [id number]  Arrival date and time: 07/18/20 7829   First Provider Initiated Contact with Patient 07/18/20 0746      Chief Complaint  Patient presents with   Emesis   Ms. Lisa Benton is a 29 y.o. G3P0020 at [redacted]w[redacted]d who presents to MAU for RUQ pain. Patient states prior to pregnancy she was being seen by a GI for "gallbladder issues" but is unsure what. She reports lymph nodes in her stomach were swollen and she had diarrhea in a month with N/V and a little pain in her RUQ. Patient reports additional "bouts" of the same since that time. Patient reports [place] food for dinner last night. Patient had a RUQ [place] in MAU about 8 weeks ago and was sent home. Patient reports the US shows sludge and gallstones. Patient has an appointment later this week to discuss gallbladder removal.  Onset: 7PM Location: RUQ Duration: <24hours Character: N/V, RUQ pain, vomiting x3 in past 12 hours Aggravating/Associated: none Relieving: time Treatment: hydromorphone 8PM, Zofran 8PM - did not work Severity: 6/10  Pt denies VB, LOF, ctx, decreased FM, vaginal discharge/odor/itching. Pt denies constipation, diarrhea, or urinary problems. Pt denies fever, chills, fatigue, sweating or changes in appetite. Pt denies SOB or chest pain. Pt denies dizziness, HA, light-headedness, weakness.  Problems this pregnancy include: gallbladder, N/V, GERD. Allergies? PCN, shellfish, stinging insets Current medications/supplements? Hydromorphone, Zofran, Diclegis (last took yesterday AM), PNVs, Pepcid Prenatal care provider? [facility], next appt 1.5 weeks   OB History     Gravida  3   Para      Term      Preterm      AB  2   Living         SAB  2   TAB      Ectopic      Multiple      Live Births              Past Medical History:  Diagnosis Date   Thoracic outlet syndrome     Past Surgical History:  Procedure Laterality Date   KIDNEY STONE SURGERY      Family History   Problem Relation Age of Onset   Diabetes Father     Social History   Tobacco Use   Smoking status: Never Smoker   Smokeless tobacco: Never Used  Substance Use Topics   Alcohol use: Not Currently   Drug use: Never    Allergies:  Allergies  Allergen Reactions   Other Anaphylaxis and Other (See Comments)    preservatives Stinging or biting creatures (insects, jellyfish)    Penicillins Anaphylaxis   Shellfish Allergy Anaphylaxis and Rash   Cephalexin Rash and Swelling   Eggs Or Egg-Derived Products Nausea And Vomiting and Other (See Comments)    Flu like symptoms- egg beaters okay    Morphine Other (See Comments)    BP dropped   Sulfamethoxazole-Trimethoprim Other (See Comments)    Secondary Yeast Infection     Medications Prior to Admission  Medication Sig Dispense Refill Last Dose   Doxylamine-Pyridoxine (DICLEGIS PO) Take by mouth.   07/17/2020 at Unknown time   famotidine (PEPCID) 10 MG tablet Take 10 mg by mouth 2 (two) times daily.   Past Week at Unknown time   Prenatal Vit-Fe Fumarate-FA (PRENATAL MULTIVITAMIN) TABS tablet Take 1 tablet by mouth daily at 12 noon.   07/17/2020 at Unknown time   promethazine (PHENERGAN) 12.5 MG tablet Take 1 tablet (12.5  mg total) by mouth every 6 (six) hours as needed for nausea or vomiting. 30 tablet 0 07/17/2020 at Unknown time   doxylamine, Sleep, (UNISOM) 25 MG tablet Take 25 mg by mouth at bedtime as needed.   More than a month at Unknown time   METOCLOPRAMIDE HCL PO Take by mouth.   Unknown at Unknown time    Review of Systems  Constitutional: Negative for chills, diaphoresis, fatigue and fever.  Eyes: Negative for visual disturbance.  Respiratory: Negative for shortness of breath.   Cardiovascular: Negative for chest pain.  Gastrointestinal: Positive for abdominal pain, nausea and vomiting. Negative for constipation and diarrhea.  Genitourinary: Negative for dysuria, flank pain, frequency, pelvic pain, urgency, vaginal  bleeding and vaginal discharge.  Neurological: Negative for dizziness, weakness, light-headedness and headaches.   Physical Exam   Blood pressure 130/77, pulse 96, temperature 98.5 F (36.9 C), temperature source Oral, resp. rate 20, height 5\' 5"  (1.651 m), weight 84.7 kg.  Patient Vitals for the past 24 hrs:  BP Temp Temp src Pulse Resp Height Weight  07/18/20 0956 108/60 -- -- 72 -- -- --  07/18/20 0830 110/65 -- -- 87 -- -- --  07/18/20 0534 130/77 98.5 F (36.9 C) Oral 96 20 5\' 5"  (1.651 m) 186 lb 11.2 oz (84.7 kg)   Physical Exam Constitutional:      General: She is not in acute distress.    Appearance: Normal appearance. She is ill-appearing. She is not toxic-appearing or diaphoretic.  HENT:     Head: Normocephalic and atraumatic.  Pulmonary:     Effort: Pulmonary effort is normal.  Abdominal:     Palpations: Abdomen is soft.     Tenderness: There is abdominal tenderness in the right upper quadrant. There is no guarding. Positive signs include Murphy's sign.  Skin:    General: Skin is warm and dry.  Neurological:     Mental Status: She is alert and oriented to person, place, and time.  Psychiatric:        Mood and Affect: Mood normal.        Behavior: Behavior normal.        Thought Content: Thought content normal.        Judgment: Judgment normal.    Results for orders placed or performed during the hospital encounter of 07/18/20 (from the past 24 hour(s))  CBC with Differential     Status: Abnormal   Collection Time: 07/18/20  6:49 AM  Result Value Ref Range   WBC 8.2 4.0 - 10.5 K/uL   RBC 3.71 (L) 3.87 - 5.11 MIL/uL   Hemoglobin 10.8 (L) 12.0 - 15.0 g/dL   HCT 09/17/20 (L) 36 - 46 %   MCV 88.4 80.0 - 100.0 fL   MCH 29.1 26.0 - 34.0 pg   MCHC 32.9 30.0 - 36.0 g/dL   RDW 09/17/20 16.1 - 09.6 %   Platelets 180 150 - 400 K/uL   nRBC 0.0 0.0 - 0.2 %   Neutrophils Relative % 76 %   Neutro Abs 6.2 1.7 - 7.7 K/uL   Lymphocytes Relative 15 %   Lymphs Abs 1.2 0.7 - 4.0  K/uL   Monocytes Relative 8 %   Monocytes Absolute 0.7 0.1 - 1.0 K/uL   Eosinophils Relative 0 %   Eosinophils Absolute 0.0 0 - 0 K/uL   Basophils Relative 0 %   Basophils Absolute 0.0 0 - 0 K/uL   Immature Granulocytes 1 %   Abs Immature Granulocytes 0.06  0.00 - 0.07 K/uL  Comprehensive metabolic panel     Status: Abnormal   Collection Time: 07/18/20  6:49 AM  Result Value Ref Range   Sodium 137 135 - 145 mmol/L   Potassium 4.1 3.5 - 5.1 mmol/L   Chloride 107 98 - 111 mmol/L   CO2 19 (L) 22 - 32 mmol/L   Glucose, Bld 120 (H) 70 - 99 mg/dL   BUN 6 6 - 20 mg/dL   Creatinine, Ser 1.610.59 0.44 - 1.00 mg/dL   Calcium 8.2 (L) 8.9 - 10.3 mg/dL   Total Protein 5.7 (L) 6.5 - 8.1 g/dL   Albumin 2.4 (L) 3.5 - 5.0 g/dL   AST 096108 (H) 15 - 41 U/L   ALT 65 (H) 0 - 44 U/L   Alkaline Phosphatase 162 (H) 38 - 126 U/L   Total Bilirubin 1.3 (H) 0.3 - 1.2 mg/dL   GFR, Estimated >04>60 >54>60 mL/min   Anion gap 11 5 - 15  Amylase     Status: None   Collection Time: 07/18/20  6:49 AM  Result Value Ref Range   Amylase 58 28 - 100 U/L  Lipase, blood     Status: None   Collection Time: 07/18/20  6:49 AM  Result Value Ref Range   Lipase 25 11 - 51 U/L  Urinalysis, Routine w reflex microscopic Urine, Clean Catch     Status: Abnormal   Collection Time: 07/18/20  6:51 AM  Result Value Ref Range   Color, Urine AMBER (A) YELLOW   APPearance CLOUDY (A) CLEAR   Specific Gravity, Urine 1.016 1.005 - 1.030   pH 6.0 5.0 - 8.0   Glucose, UA >=500 (A) NEGATIVE mg/dL   Hgb urine dipstick NEGATIVE NEGATIVE   Bilirubin Urine SMALL (A) NEGATIVE   Ketones, ur 5 (A) NEGATIVE mg/dL   Protein, ur 30 (A) NEGATIVE mg/dL   Nitrite NEGATIVE NEGATIVE   Leukocytes,Ua TRACE (A) NEGATIVE   RBC / HPF 0-5 0 - 5 RBC/hpf   WBC, UA 11-20 0 - 5 WBC/hpf   Bacteria, UA MANY (A) NONE SEEN   Squamous Epithelial / LPF 0-5 0 - 5   Mucus PRESENT    Hyaline Casts, UA PRESENT    US Abdomen Limited RUQ  Result Date: 07/18/2020 CLINICAL  DATA:  Right upper quadrant pain with known gallstones and sludge EXAM: ULTRASOUND ABDOMEN LIMITED RIGHT UPPER QUADRANT COMPARISON:  05/10/2020 FINDINGS: Gallbladder: Resolution of gallbladder sludge. Presumed nonmobile stones of up to 5 mm. Gallbladder polyps are felt less likely, given mobile stones on the prior ultrasound. No wall thickening or pericholecystic fluid. Sonographic Murphy's sign was not elicited. Common bile duct: Diameter: Normal, 4 mm. Liver: No focal lesion identified. Within normal limits in parenchymal echogenicity. Portal vein is patent on color Doppler imaging with normal direction of blood flow towards the liver. Other: None. IMPRESSION: Cholelithiasis without cholecystitis or biliary duct dilatation. Electronically Signed   By: Jeronimo GreavesKyle  Talbot M.D.   On: 07/18/2020 08:02    MAU Course  Procedures  MDM -RUQ pain with US on 05/10/2020 showing sludge and stones -+RUQ tenderness on exam rating 6/10 -nausea with vomiting x3 in past 12 hours -UA: amber/cloudy/>/=500GLU/sm bilirubin/5ketones/PRO/trace leuks/many bacteria, sending urine for culture -CBC: H/H 10.8/32.8 -CMP: AST/ALT 108/65, total bilirubin 1.3 -US: resolution of gallbladder sludge, nonmobile stones, no wall thickening or pericholecystic fluid, no cholecystitis -amylase/lipase ordered -1L LR + 8mg  Zofran + 40mg  Protonix given -called and consulted with Juluis MireWilliam Jennings, PA - general surgery -  to discuss patient case, will discuss with attending and call back with recommendations. Betha Loa consulted with Dr. Janee Morn, who agrees with plan to treat symptomatically until delivery. Can trend labs until that time. -EFM: reactive       -baseline: 130       -variability: moderate       -accels: present, 15x15       -decels: absent       -TOCO: irritability -report given to Judeth Horn, who assumes care @819AM  Nugent, , NP  8:19 AM 07/18/2020   Reviewed labs & patient with Dr. 09/17/2020. Ok to discharge  home. Patient to keep f/u with general surgery later this week.  Patient reports improvement in pain, down to 3/10. IM phenergan given prior to being discharged.       Orders Placed This Encounter  Procedures   Culture, OB Urine    Standing Status:   Standing    Number of Occurrences:   1   Jolayne Panther Abdomen Limited RUQ    Standing Status:   Standing    Number of Occurrences:   1    Order Specific Question:   Symptom/Reason for Exam    Answer:   RUQ pain [251471]   CBC with Differential    Standing Status:   Standing    Number of Occurrences:   1   Comprehensive metabolic panel    Standing Status:   Standing    Number of Occurrences:   1   Urinalysis, Routine w reflex microscopic Urine, Clean Catch    Standing Status:   Standing    Number of Occurrences:   1   Amylase    Standing Status:   Standing    Number of Occurrences:   1   Lipase, blood    Standing Status:   Standing    Number of Occurrences:   1   Insert peripheral IV    Standing Status:   Standing    Number of Occurrences:   1   Meds ordered this encounter  Medications   lactated ringers bolus 1,000 mL   ondansetron (ZOFRAN) 8 mg in sodium chloride 0.9 % 50 mL IVPB   pantoprazole (PROTONIX) injection 40 mg    Assessment and Plan   1. Calculus of gallbladder without cholecystitis without obstruction  -no signs of infection or blockage. Patient to keep appointment with general surgery later this week.  -reviewed recommended dietary restrictions to help with symptoms -rx phenergan prn -discussed reasons to return to MAU  2. RUQ pain   3. [redacted] weeks gestation of pregnancy      [place], NP

## 2020-07-18 NOTE — MAU Note (Signed)
PT SAYS HAS GALLSTONES - EVEN BEFORE PREG . VOMITED 0530.Marland Kitchen  TOOK HYDROMORPHONE AT 9PM-    HAS HAD PAIN SINCE 5 PM.

## 2020-07-19 ENCOUNTER — Other Ambulatory Visit: Payer: Self-pay

## 2020-07-19 ENCOUNTER — Inpatient Hospital Stay (HOSPITAL_COMMUNITY)
Admission: AD | Admit: 2020-07-19 | Discharge: 2020-07-20 | Disposition: A | Discharge status: Home / Self Care | Payer: Managed Care, Other (non HMO) | Source: Ambulatory Visit | Attending: Obstetrics and Gynecology | Admitting: Obstetrics and Gynecology

## 2020-07-19 ENCOUNTER — Encounter (HOSPITAL_COMMUNITY): Payer: Self-pay | Admitting: Obstetrics and Gynecology

## 2020-07-19 DIAGNOSIS — R1011 Right upper quadrant pain: Secondary | ICD-10-CM

## 2020-07-19 DIAGNOSIS — O212 Late vomiting of pregnancy: Secondary | ICD-10-CM | POA: Diagnosis not present

## 2020-07-19 DIAGNOSIS — Z79899 Other long term (current) drug therapy: Secondary | ICD-10-CM | POA: Insufficient documentation

## 2020-07-19 DIAGNOSIS — K8020 Calculus of gallbladder without cholecystitis without obstruction: Secondary | ICD-10-CM | POA: Diagnosis not present

## 2020-07-19 DIAGNOSIS — O26613 Liver and biliary tract disorders in pregnancy, third trimester: Secondary | ICD-10-CM | POA: Diagnosis present

## 2020-07-19 DIAGNOSIS — O219 Vomiting of pregnancy, unspecified: Secondary | ICD-10-CM

## 2020-07-19 DIAGNOSIS — O26893 Other specified pregnancy related conditions, third trimester: Secondary | ICD-10-CM | POA: Diagnosis not present

## 2020-07-19 DIAGNOSIS — Z3A34 34 weeks gestation of pregnancy: Secondary | ICD-10-CM | POA: Diagnosis not present

## 2020-07-19 LAB — CULTURE, OB URINE

## 2020-07-19 LAB — URINALYSIS, ROUTINE W REFLEX MICROSCOPIC
Glucose, UA: NEGATIVE mg/dL
Hgb urine dipstick: NEGATIVE
Ketones, ur: >80 mg/dL — AB
Leukocytes,Ua: NEGATIVE
Nitrite: NEGATIVE
Protein, ur: NEGATIVE mg/dL
Specific Gravity, Urine: 1.01 (ref 1.005–1.030)
pH: 6.5 (ref 5.0–8.0)

## 2020-07-19 MED ORDER — PROMETHAZINE HCL 25 MG/ML IJ SOLN
12.5000 mg | Freq: Once | INTRAMUSCULAR | Status: AC
  Administered 2020-07-19: 12.5 mg via INTRAVENOUS
  Filled 2020-07-19: qty 1

## 2020-07-19 MED ORDER — NALBUPHINE HCL 10 MG/ML IJ SOLN
10.0000 mg | Freq: Once | INTRAMUSCULAR | Status: AC
  Administered 2020-07-19: 10 mg via INTRAVENOUS
  Filled 2020-07-19: qty 1

## 2020-07-19 MED ORDER — LACTATED RINGERS IV BOLUS
1000.0000 mL | Freq: Once | INTRAVENOUS | Status: AC
  Administered 2020-07-19: 1000 mL via INTRAVENOUS

## 2020-07-19 NOTE — MAU Note (Signed)
Pt reports that she is has been having pain in her gallbladder since Friday that she rates 10/10 on pain scale.  Pt states that she is not able keep anything down.  She last ate oatmeal this am.  +FM.

## 2020-07-20 ENCOUNTER — Ambulatory Visit: Payer: Self-pay | Admitting: General Surgery

## 2020-07-20 MED ORDER — NALBUPHINE HCL 10 MG/ML IJ SOLN
10.0000 mg | Freq: Once | INTRAMUSCULAR | Status: AC
  Administered 2020-07-20: 10 mg via INTRAVENOUS
  Filled 2020-07-20: qty 1

## 2020-07-20 MED ORDER — FAMOTIDINE IN NACL 20-0.9 MG/50ML-% IV SOLN
20.0000 mg | Freq: Once | INTRAVENOUS | Status: AC
  Administered 2020-07-20: 20 mg via INTRAVENOUS
  Filled 2020-07-20: qty 50

## 2020-07-20 MED ORDER — LACTATED RINGERS IV BOLUS
1000.0000 mL | Freq: Once | INTRAVENOUS | Status: AC
  Administered 2020-07-20: 1000 mL via INTRAVENOUS

## 2020-07-20 NOTE — Discharge Instructions (Signed)
Cholelithiasis  Cholelithiasis is also called "gallstones." It is a kind of gallbladder disease. The gallbladder is an organ that stores a liquid (bile) that helps you digest fat. Gallstones may not cause symptoms (may be silent gallstones) until they cause a blockage, and then they can cause pain (gallbladder attack). Follow these instructions at home:  Take over-the-counter and prescription medicines only as told by your doctor.  Stay at a healthy weight.  Eat healthy foods. This includes: ? Eating fewer fatty foods, like fried foods. ? Eating fewer refined carbs (refined carbohydrates). Refined carbs are breads and grains that are highly processed, like white bread and white rice. Instead, choose whole grains like whole-wheat bread and brown rice. ? Eating more fiber. Almonds, fresh fruit, and beans are healthy sources of fiber.  Keep all follow-up visits as told by your doctor. This is important. Contact a doctor if:  You have sudden pain in the upper right side of your belly (abdomen). Pain might spread to your right shoulder or your chest. This may be a sign of a gallbladder attack.  You feel sick to your stomach (are nauseous).  You throw up (vomit).  You have been diagnosed with gallstones that have no symptoms and you get: ? Belly pain. ? Discomfort, burning, or fullness in the upper part of your belly (indigestion). Get help right away if:  You have sudden pain in the upper right side of your belly, and it lasts for more than 2 hours.  You have belly pain that lasts for more than 5 hours.  You have a fever or chills.  You keep feeling sick to your stomach or you keep throwing up.  Your skin or the whites of your eyes turn yellow (jaundice).  You have dark-colored pee (urine).  You have light-colored poop (stool). Summary  Cholelithiasis is also called "gallstones."  The gallbladder is an organ that stores a liquid (bile) that helps you digest fat.  Silent  gallstones are gallstones that do not cause symptoms.  A gallbladder attack may cause sudden pain in the upper right side of your belly. Pain might spread to your right shoulder or your chest. If this happens, contact your doctor.  If you have sudden pain in the upper right side of your belly that lasts for more than 2 hours, get help right away. This information is not intended to replace advice given to you by your health care provider. Make sure you discuss any questions you have with your health care provider. Document Revised: 09/08/2017 Document Reviewed: 06/12/2016 Elsevier Patient Education  2020 [employer]  

## 2020-07-20 NOTE — MAU Provider Note (Signed)
History     CSN: [id number]  Arrival date and time: 07/19/20 2108   First Provider Initiated Contact with Patient 07/19/20 2155      Chief Complaint  Patient presents with  . Nausea  . Abdominal Pain   Lisa Benton is a 29 y.o. G3P0 at [redacted]w[redacted]d who presents to MAU with complaints of RUQ abdominal pain and nausea. Patient has been seen multiple times over the course of this weekend for same complaint. Patient was diagnosed with calculus of gallbladder. She has a consult with [facility] scheduled for Friday 10/15. Patient reports that pain increased this morning. She reports that she is prescribed hydromorphone for pain and last took Saturday AM. Rates pain 10/10. She reports nausea is associated with pain and that she is unable to eat since this morning. She denies lower abdominal cramping or contractions. Denies vaginal bleeding, discharge or LOF. Denies pregnancy complaints or concerns. +FM.    OB History    Gravida  3   Para      Term      Preterm      AB  2   Living        SAB  2   TAB      Ectopic      Multiple      Live Births              Past Medical History:  Diagnosis Date  . Thoracic outlet syndrome     Past Surgical History:  Procedure Laterality Date  . KIDNEY STONE SURGERY      Family History  Problem Relation Age of Onset  . Diabetes Father     Social History   Tobacco Use  . Smoking status: Never Smoker  . Smokeless tobacco: Never Used  Substance Use Topics  . Alcohol use: Not Currently  . Drug use: Never    Allergies:  Allergies  Allergen Reactions  . Other Anaphylaxis and Other (See Comments)    preservatives Stinging or biting creatures (insects, jellyfish)   . Penicillins Anaphylaxis  . Shellfish Allergy Anaphylaxis and Rash  . Cephalexin Rash and Swelling  . Eggs Or Egg-Derived Products Nausea And Vomiting and Other (See Comments)    Flu like symptoms- egg beaters okay   . Morphine Other (See Comments)     BP dropped  . Sulfamethoxazole-Trimethoprim Other (See Comments)    Secondary Yeast Infection     Medications Prior to Admission  Medication Sig Dispense Refill Last Dose  . Doxylamine-Pyridoxine (DICLEGIS PO) Take by mouth.   07/19/2020 at Unknown time  . famotidine (PEPCID) 10 MG tablet Take 10 mg by mouth 2 (two) times daily.   07/19/2020 at Unknown time  . METOCLOPRAMIDE HCL PO Take by mouth.   Past Month at Unknown time  . Prenatal Vit-Fe Fumarate-FA (PRENATAL MULTIVITAMIN) TABS tablet Take 1 tablet by mouth daily at 12 noon.   07/19/2020 at Unknown time  . promethazine (PHENERGAN) 25 MG tablet Take 1 tablet (25 mg total) by mouth every 8 (eight) hours as needed for nausea or vomiting. 30 tablet 0 07/19/2020 at Unknown time    Review of Systems  Constitutional: Negative.   Respiratory: Negative.   Cardiovascular: Negative.   Gastrointestinal: Positive for abdominal pain and nausea. Negative for constipation and diarrhea.  Genitourinary: Negative.   Musculoskeletal: Negative.   Neurological: Negative.   Psychiatric/Behavioral: Negative.    Physical Exam   Blood pressure 114/75, pulse 91, temperature 98.1 F (36.7 C), temperature  source Oral, resp. rate 18, weight 82.1 kg, SpO2 99 %.  Physical Exam Vitals and nursing note reviewed.  HENT:     Head: Normocephalic.  Cardiovascular:     Rate and Rhythm: Normal rate and regular rhythm.  Pulmonary:     Effort: Pulmonary effort is normal.     Breath sounds: Normal breath sounds.  Abdominal:     Tenderness: There is abdominal tenderness in the right upper quadrant. There is guarding. There is no right CVA tenderness or left CVA tenderness. Positive signs include Murphy's sign.     Comments: Gravid appropriate for gestational age  Skin:    General: Skin is warm and dry.  Neurological:     Mental Status: She is alert and oriented to person, place, and time.  Psychiatric:        Mood and Affect: Mood normal.         Behavior: Behavior normal.    Fetal monitoring:  130/moderate/+accels/no decels UI   MAU Course  Procedures  MDM [place] completed yesterday on 10/9, no repeat US performed tonight  - MAU treatment for pain control and nausea relief.   Orders Placed This Encounter  Procedures  . Urinalysis, Routine w reflex microscopic Urine, Clean Catch  . Insert peripheral IV   Meds ordered this encounter  Medications  . lactated ringers bolus 1,000 mL  . nalbuphine (NUBAIN) injection 10 mg  . promethazine (PHENERGAN) injection 12.5 mg  . famotidine (PEPCID) IVPB 20 mg premix  . nalbuphine (NUBAIN) injection 10 mg  . lactated ringers bolus 1,000 mL   Initial Treatment included IV LR bolus, nubain 10mg  IV, and phenergan 12.5mg  Reassessment an hour later, patient asleep in bed. Patient woke up 2-3 hours later reporting that pain is starting to return, IV pepcid and additional pain medication ordered.   Reassessment - patient reports that pain has significantly decreased.  Discussed with patient to keep appointment scheduled, can call office and see if sooner appointment available Continue medication as prescribed at home  Reviewed dietary restrictions to help with symptoms and discussed reasons to return to MAU. Pt stable at time of discharge. NST reactive and reassuring for GA.   Assessment and Plan   1. Calculus of gallbladder without cholecystitis without obstruction   2. Nausea and vomiting in pregnancy   3. RUQ pain    Discharge home Follow up as scheduled in the office for prenatal care Follow up with [facility]  Return to MAU as needed for reasons discussed and/or emergencies  Continue medication as prescribed    Follow-up Information    [facility] Follow up.   Specialty: General Surgery Contact information: [address] [place] [phone]              Allergies as of 07/20/2020      Reactions   Other  Anaphylaxis, Other (See Comments)   preservatives Stinging or biting creatures (insects, jellyfish)   Penicillins Anaphylaxis   Shellfish Allergy Anaphylaxis, Rash   Cephalexin Rash, Swelling   Eggs Or Egg-derived Products Nausea And Vomiting, Other (See Comments)   Flu like symptoms- egg beaters okay   Morphine Other (See Comments)   BP dropped   Sulfamethoxazole-trimethoprim Other (See Comments)   Secondary Yeast Infection      Medication List    TAKE these medications   DICLEGIS PO Take by mouth.   famotidine 10 MG tablet Commonly known as: PEPCID Take 10 mg by mouth 2 (  two) times daily.   METOCLOPRAMIDE HCL PO Take by mouth.   prenatal multivitamin Tabs tablet Take 1 tablet by mouth daily at 12 noon.   promethazine 25 MG tablet Commonly known as: PHENERGAN Take 1 tablet (25 mg total) by mouth every 8 (eight) hours as needed for nausea or vomiting.       Sharyon Cable CNM 07/20/2020, 2:51 AM

## 2020-07-21 ENCOUNTER — Other Ambulatory Visit: Payer: Self-pay

## 2020-07-23 ENCOUNTER — Other Ambulatory Visit: Payer: Self-pay | Admitting: Obstetrics and Gynecology

## 2020-07-23 DIAGNOSIS — K8020 Calculus of gallbladder without cholecystitis without obstruction: Secondary | ICD-10-CM

## 2020-07-23 DIAGNOSIS — O26613 Liver and biliary tract disorders in pregnancy, third trimester: Secondary | ICD-10-CM

## 2020-08-04 LAB — OB RESULTS CONSOLE GBS: GBS: NEGATIVE

## 2020-08-06 ENCOUNTER — Encounter: Payer: Self-pay | Admitting: *Deleted

## 2020-08-06 ENCOUNTER — Ambulatory Visit: Payer: Managed Care, Other (non HMO)

## 2020-08-06 ENCOUNTER — Ambulatory Visit: Payer: Managed Care, Other (non HMO) | Admitting: *Deleted

## 2020-08-06 ENCOUNTER — Other Ambulatory Visit: Payer: Self-pay

## 2020-08-06 ENCOUNTER — Ambulatory Visit: Payer: Managed Care, Other (non HMO) | Attending: Obstetrics and Gynecology

## 2020-08-06 VITALS — BP 88/60 | HR 88

## 2020-08-06 DIAGNOSIS — O26619 Liver and biliary tract disorders in pregnancy, unspecified trimester: Secondary | ICD-10-CM | POA: Diagnosis present

## 2020-08-06 DIAGNOSIS — K8020 Calculus of gallbladder without cholecystitis without obstruction: Secondary | ICD-10-CM | POA: Diagnosis not present

## 2020-08-06 DIAGNOSIS — O26613 Liver and biliary tract disorders in pregnancy, third trimester: Secondary | ICD-10-CM | POA: Diagnosis present

## 2020-08-06 DIAGNOSIS — Z3A37 37 weeks gestation of pregnancy: Secondary | ICD-10-CM | POA: Diagnosis not present

## 2020-08-06 DIAGNOSIS — Z363 Encounter for antenatal screening for malformations: Secondary | ICD-10-CM | POA: Diagnosis not present

## 2020-08-13 ENCOUNTER — Telehealth (HOSPITAL_COMMUNITY): Payer: Self-pay | Admitting: *Deleted

## 2020-08-13 NOTE — Telephone Encounter (Signed)
Preadmission screen  

## 2020-08-14 ENCOUNTER — Encounter (HOSPITAL_COMMUNITY): Payer: Self-pay | Admitting: *Deleted

## 2020-08-14 ENCOUNTER — Telehealth (HOSPITAL_COMMUNITY): Payer: Self-pay | Admitting: *Deleted

## 2020-08-14 NOTE — Telephone Encounter (Signed)
Preadmission screen  

## 2020-08-17 ENCOUNTER — Other Ambulatory Visit (HOSPITAL_COMMUNITY)
Admission: RE | Admit: 2020-08-17 | Discharge: 2020-08-17 | Disposition: A | Discharge status: Home / Self Care | Payer: Managed Care, Other (non HMO) | Source: Ambulatory Visit | Attending: Obstetrics and Gynecology | Admitting: Obstetrics and Gynecology

## 2020-08-17 DIAGNOSIS — Z01812 Encounter for preprocedural laboratory examination: Secondary | ICD-10-CM | POA: Insufficient documentation

## 2020-08-17 DIAGNOSIS — Z20822 Contact with and (suspected) exposure to covid-19: Secondary | ICD-10-CM | POA: Insufficient documentation

## 2020-08-17 LAB — SARS CORONAVIRUS 2 (TAT 6-24 HRS): SARS Coronavirus 2: NEGATIVE

## 2020-08-18 ENCOUNTER — Other Ambulatory Visit: Payer: Self-pay | Admitting: Obstetrics and Gynecology

## 2020-08-18 NOTE — H&P (Signed)
Lisa Benton is a 29 y.o. female G3P0020 at 39 wks and 0 days  presenting for induction of labor due to cholelithiasis with acute flare during pregnancy. Pt seen by dr. Parke Poisson with MFM who recommend delivery at 39 wks. Patient is scheduled for cholecystectomy in 2-3 weeks. Pregnancy has been otherwise uncomplicated .   OB History    Gravida  3   Para      Term      Preterm      AB  2   Living        SAB  2   TAB      Ectopic      Multiple      Live Births             Past Medical History:  Diagnosis Date  . Chronic kidney disease    Kidney stones  . Gall stones   . Headache    Migraines  . Thoracic outlet syndrome    Past Surgical History:  Procedure Laterality Date  . FOOT SURGERY Left   . KIDNEY STONE SURGERY    . WISDOM TOOTH EXTRACTION     Family History: family history includes Cancer in her maternal grandfather; Diabetes in her father. Social History:  reports that she has never smoked. She has never used smokeless tobacco. She reports previous alcohol use. She reports that she does not use drugs.     Maternal Diabetes: No Genetic Screening: Normal Maternal Ultrasounds/Referrals: Normal Fetal Ultrasounds or other Referrals:  None Maternal Substance Abuse:  No Significant Maternal Medications:  None Significant Maternal Lab Results:  Group B Strep negative Other Comments:  None  Review of Systems  Constitutional: Negative.   HENT: Negative.   Eyes: Negative.   Respiratory: Negative.   Cardiovascular: Negative.   Gastrointestinal: Positive for abdominal pain.  Endocrine: Negative.   Genitourinary: Negative.   Musculoskeletal: Negative.   Allergic/Immunologic: Negative.   Neurological: Negative.   Hematological: Negative.   Psychiatric/Behavioral: Negative.    History   Last menstrual period 10/21/2019. Maternal Exam:  Introitus: Normal vulva.   Physical Exam Vitals reviewed.  Constitutional:      Appearance: Normal appearance.   HENT:     Head: Normocephalic and atraumatic.     Nose: Nose normal.  Eyes:     Pupils: Pupils are equal, round, and reactive to light.  Cardiovascular:     Rate and Rhythm: Normal rate and regular rhythm.     Pulses: Normal pulses.     Heart sounds: Normal heart sounds.  Pulmonary:     Effort: Pulmonary effort is normal.     Breath sounds: Normal breath sounds.  Abdominal:     Tenderness: There is abdominal tenderness.  Genitourinary:    General: Normal vulva.  Musculoskeletal:        General: Normal range of motion.     Cervical back: Normal range of motion and neck supple.  Skin:    General: Skin is warm and dry.  Neurological:     General: No focal deficit present.     Mental Status: She is alert and oriented to person, place, and time.  Psychiatric:        Mood and Affect: Mood normal.        Behavior: Behavior normal.     Prenatal labs: ABO, Rh:    A Negative  Antibody:  Negative  Rubella:  Immune  RPR:    nonreactive  HBsAg:   Negative  HIV:  Negative  GBS:   Negative   Assessment/Plan: 39 wks and 0 days for induction due to cholelithiasis affecting pregnancy .  - Cytotec for cervical ripening.  -planning  epidural for pain control  -Anticpate SVD -RH negative - rhogham post delivery if indicated  -Cholecystectomy planned 2-3 wks post delivery .. low fat diet until then    Gerald Leitz 08/18/2020, 7:46 PM

## 2020-08-18 NOTE — H&P (Deleted)
  The note originally documented on this encounter has been moved the the encounter in which it belongs.  

## 2020-08-19 ENCOUNTER — Inpatient Hospital Stay (HOSPITAL_COMMUNITY): Payer: Managed Care, Other (non HMO)

## 2020-08-19 ENCOUNTER — Inpatient Hospital Stay (HOSPITAL_COMMUNITY): Payer: Managed Care, Other (non HMO) | Admitting: Anesthesiology

## 2020-08-19 ENCOUNTER — Other Ambulatory Visit: Payer: Self-pay

## 2020-08-19 ENCOUNTER — Inpatient Hospital Stay (HOSPITAL_COMMUNITY)
Admission: AD | Admit: 2020-08-19 | Discharge: 2020-08-22 | DRG: 807 | Disposition: A | Discharge status: Home / Self Care | Payer: Managed Care, Other (non HMO) | Attending: Obstetrics and Gynecology | Admitting: Obstetrics and Gynecology

## 2020-08-19 ENCOUNTER — Encounter (HOSPITAL_COMMUNITY): Payer: Self-pay | Admitting: Obstetrics and Gynecology

## 2020-08-19 DIAGNOSIS — Z3A39 39 weeks gestation of pregnancy: Secondary | ICD-10-CM

## 2020-08-19 DIAGNOSIS — Z3490 Encounter for supervision of normal pregnancy, unspecified, unspecified trimester: Secondary | ICD-10-CM

## 2020-08-19 DIAGNOSIS — N189 Chronic kidney disease, unspecified: Secondary | ICD-10-CM | POA: Diagnosis present

## 2020-08-19 DIAGNOSIS — Z6791 Unspecified blood type, Rh negative: Secondary | ICD-10-CM

## 2020-08-19 DIAGNOSIS — Z23 Encounter for immunization: Secondary | ICD-10-CM

## 2020-08-19 DIAGNOSIS — O99892 Other specified diseases and conditions complicating childbirth: Secondary | ICD-10-CM | POA: Diagnosis present

## 2020-08-19 DIAGNOSIS — Z20822 Contact with and (suspected) exposure to covid-19: Secondary | ICD-10-CM | POA: Diagnosis present

## 2020-08-19 DIAGNOSIS — O2662 Liver and biliary tract disorders in childbirth: Principal | ICD-10-CM | POA: Diagnosis present

## 2020-08-19 DIAGNOSIS — K8020 Calculus of gallbladder without cholecystitis without obstruction: Secondary | ICD-10-CM

## 2020-08-19 DIAGNOSIS — O26893 Other specified pregnancy related conditions, third trimester: Secondary | ICD-10-CM | POA: Diagnosis present

## 2020-08-19 HISTORY — DX: Nausea with vomiting, unspecified: Z98.890

## 2020-08-19 HISTORY — DX: Nausea with vomiting, unspecified: R11.2

## 2020-08-19 LAB — RPR: RPR Ser Ql: NONREACTIVE

## 2020-08-19 LAB — CBC
HCT: 32.4 % — ABNORMAL LOW (ref 36.0–46.0)
Hemoglobin: 10.5 g/dL — ABNORMAL LOW (ref 12.0–15.0)
MCH: 28.4 pg (ref 26.0–34.0)
MCHC: 32.4 g/dL (ref 30.0–36.0)
MCV: 87.6 fL (ref 80.0–100.0)
Platelets: 163 10*3/uL (ref 150–400)
RBC: 3.7 MIL/uL — ABNORMAL LOW (ref 3.87–5.11)
RDW: 13.5 % (ref 11.5–15.5)
WBC: 7.8 10*3/uL (ref 4.0–10.5)
nRBC: 0 % (ref 0.0–0.2)

## 2020-08-19 LAB — TYPE AND SCREEN
ABO/RH(D): A NEG
Antibody Screen: NEGATIVE

## 2020-08-19 MED ORDER — LACTATED RINGERS IV SOLN: INTRAVENOUS | Status: DC

## 2020-08-19 MED ORDER — FENTANYL-BUPIVACAINE-NACL 0.5-0.125-0.9 MG/250ML-% EP SOLN
EPIDURAL | Status: AC
  Filled 2020-08-19: qty 250

## 2020-08-19 MED ORDER — TERBUTALINE SULFATE 1 MG/ML IJ SOLN: 0.2500 mg | Freq: Once | INTRAMUSCULAR | Status: DC | PRN

## 2020-08-19 MED ORDER — OXYCODONE-ACETAMINOPHEN 5-325 MG PO TABS: 1.0000 | ORAL_TABLET | ORAL | Status: DC | PRN

## 2020-08-19 MED ORDER — LIDOCAINE-EPINEPHRINE (PF) 2 %-1:200000 IJ SOLN
INTRAMUSCULAR | Status: DC | PRN
  Administered 2020-08-19: 5 mL via EPIDURAL

## 2020-08-19 MED ORDER — DIPHENHYDRAMINE HCL 50 MG/ML IJ SOLN
50.0000 mg | Freq: Once | INTRAMUSCULAR | Status: AC
  Administered 2020-08-19: 50 mg via INTRAVENOUS
  Filled 2020-08-19: qty 1

## 2020-08-19 MED ORDER — ONDANSETRON HCL 4 MG/2ML IJ SOLN
4.0000 mg | Freq: Four times a day (QID) | INTRAMUSCULAR | Status: DC | PRN
  Administered 2020-08-19: 4 mg via INTRAVENOUS
  Filled 2020-08-19: qty 2

## 2020-08-19 MED ORDER — LIDOCAINE HCL (PF) 1 % IJ SOLN
30.0000 mL | INTRAMUSCULAR | Status: AC | PRN
  Administered 2020-08-20: 30 mL via SUBCUTANEOUS
  Filled 2020-08-19: qty 30

## 2020-08-19 MED ORDER — SODIUM CHLORIDE (PF) 0.9 % IJ SOLN
INTRAMUSCULAR | Status: DC | PRN
  Administered 2020-08-19: 12 mL/h via EPIDURAL

## 2020-08-19 MED ORDER — PHENYLEPHRINE 40 MCG/ML (10ML) SYRINGE FOR IV PUSH (FOR BLOOD PRESSURE SUPPORT)
PREFILLED_SYRINGE | INTRAVENOUS | Status: AC
  Filled 2020-08-19: qty 10

## 2020-08-19 MED ORDER — SOD CITRATE-CITRIC ACID 500-334 MG/5ML PO SOLN
30.0000 mL | ORAL | Status: DC | PRN
  Administered 2020-08-19: 30 mL via ORAL
  Filled 2020-08-19: qty 15

## 2020-08-19 MED ORDER — MISOPROSTOL 25 MCG QUARTER TABLET
25.0000 ug | ORAL_TABLET | ORAL | Status: DC | PRN
  Administered 2020-08-19 (×3): 25 ug via VAGINAL
  Filled 2020-08-19 (×3): qty 1

## 2020-08-19 MED ORDER — OXYTOCIN-SODIUM CHLORIDE 30-0.9 UT/500ML-% IV SOLN
1.0000 m[IU]/min | INTRAVENOUS | Status: DC
  Administered 2020-08-19: 2 m[IU]/min via INTRAVENOUS

## 2020-08-19 MED ORDER — LACTATED RINGERS IV SOLN
500.0000 mL | INTRAVENOUS | Status: DC | PRN
  Administered 2020-08-19: 1000 mL via INTRAVENOUS

## 2020-08-19 MED ORDER — OXYTOCIN-SODIUM CHLORIDE 30-0.9 UT/500ML-% IV SOLN
2.5000 [IU]/h | INTRAVENOUS | Status: DC
  Administered 2020-08-20: 2.5 [IU]/h via INTRAVENOUS
  Filled 2020-08-19 (×2): qty 500

## 2020-08-19 MED ORDER — FENTANYL CITRATE (PF) 100 MCG/2ML IJ SOLN
50.0000 ug | INTRAMUSCULAR | Status: DC | PRN
  Administered 2020-08-19 (×2): 100 ug via INTRAVENOUS
  Filled 2020-08-19 (×2): qty 2

## 2020-08-19 MED ORDER — OXYCODONE-ACETAMINOPHEN 5-325 MG PO TABS: 2.0000 | ORAL_TABLET | ORAL | Status: DC | PRN

## 2020-08-19 MED ORDER — OXYTOCIN BOLUS FROM INFUSION
333.0000 mL | Freq: Once | INTRAVENOUS | Status: AC
  Administered 2020-08-20: 333 mL via INTRAVENOUS

## 2020-08-19 MED ORDER — ACETAMINOPHEN 325 MG PO TABS: 650.0000 mg | ORAL_TABLET | ORAL | Status: DC | PRN

## 2020-08-19 MED ORDER — HYDROXYZINE HCL 50 MG PO TABS: 50.0000 mg | ORAL_TABLET | Freq: Four times a day (QID) | ORAL | Status: DC | PRN

## 2020-08-19 NOTE — Anesthesia Procedure Notes (Signed)
Epidural Patient location during procedure: OB Start time: 08/19/2020 8:35 PM End time: 08/19/2020 8:45 PM  Staffing Anesthesiologist: Elmer Picker, MD Performed: anesthesiologist   Preanesthetic Checklist Completed: patient identified, IV checked, risks and benefits discussed, monitors and equipment checked, pre-op evaluation and timeout performed  Epidural Patient position: sitting Prep: DuraPrep and site prepped and draped Patient monitoring: continuous pulse ox, blood pressure, heart rate and cardiac monitor Approach: midline Location: L3-L4 Injection technique: LOR air  Needle:  Needle type: Tuohy  Needle gauge: 17 G Needle length: 9 cm Needle insertion depth: 5 cm Catheter type: closed end flexible Catheter size: 19 Gauge Catheter at skin depth: 10 cm Test dose: negative  Assessment Sensory level: T8 Events: blood not aspirated, injection not painful, no injection resistance, no paresthesia and negative IV test  Additional Notes Patient identified. Risks/Benefits/Options discussed with patient including but not limited to bleeding, infection, nerve damage, paralysis, failed block, incomplete pain control, headache, blood pressure changes, nausea, vomiting, reactions to medication both or allergic, itching and postpartum back pain. Confirmed with bedside nurse the patient's most recent platelet count. Confirmed with patient that they are not currently taking any anticoagulation, have any bleeding history or any family history of bleeding disorders. Patient expressed understanding and wished to proceed. All questions were answered. Sterile technique was used throughout the entire procedure. Please see nursing notes for vital signs. Test dose was given through epidural catheter and negative prior to continuing to dose epidural or start infusion. Warning signs of high block given to the patient including shortness of breath, tingling/numbness in hands, complete motor block,  or any concerning symptoms with instructions to call for help. Patient was given instructions on fall risk and not to get out of bed. All questions and concerns addressed with instructions to call with any issues or inadequate analgesia.  Reason for block:procedure for pain

## 2020-08-19 NOTE — Anesthesia Preprocedure Evaluation (Signed)
Anesthesia Evaluation  Patient identified by MRN, date of birth, ID band Patient awake    Reviewed: Allergy & Precautions, NPO status , Patient's Chart, lab work & pertinent test results  History of Anesthesia Complications (+) PONV  Airway Mallampati: II  TM Distance: >3 FB Neck ROM: Full    Dental no notable dental hx.    Pulmonary neg pulmonary ROS,    Pulmonary exam normal breath sounds clear to auscultation       Cardiovascular negative cardio ROS Normal cardiovascular exam Rhythm:Regular Rate:Normal     Neuro/Psych negative neurological ROS  negative psych ROS   GI/Hepatic negative GI ROS, Neg liver ROS,   Endo/Other  negative endocrine ROS  Renal/GU negative Renal ROS  negative genitourinary   Musculoskeletal negative musculoskeletal ROS (+)   Abdominal   Peds  Hematology  (+) Blood dyscrasia (Hgb 10.5), anemia ,   Anesthesia Other Findings IOL for cholelithiasis with acute flare during pregnancy  Reproductive/Obstetrics (+) Pregnancy                             Anesthesia Physical Anesthesia Plan  ASA: II  Anesthesia Plan: Epidural   Post-op Pain Management:    Induction:   PONV Risk Score and Plan: Treatment may vary due to age or medical condition  Airway Management Planned: Natural Airway  Additional Equipment:   Intra-op Plan:   Post-operative Plan:   Informed Consent: I have reviewed the patients History and Physical, chart, labs and discussed the procedure including the risks, benefits and alternatives for the proposed anesthesia with the patient or authorized representative who has indicated his/her understanding and acceptance.       Plan Discussed with: Anesthesiologist  Anesthesia Plan Comments: (Patient identified. Risks, benefits, options discussed with patient including but not limited to bleeding, infection, nerve damage, paralysis, failed  block, incomplete pain control, headache, blood pressure changes, nausea, vomiting, reactions to medication, itching, and post partum back pain. Confirmed with bedside nurse the patient's most recent platelet count. Confirmed with the patient that they are not taking any anticoagulation, have any bleeding history or any family history of bleeding disorders. Patient expressed understanding and wishes to proceed. All questions were answered. )        Anesthesia Quick Evaluation

## 2020-08-19 NOTE — Progress Notes (Signed)
   Subjective: Rates contractions 4 out of 10. Some bloody show no lof +FM pain in ruq at gallbladder with contraction   Objective: BP 112/72   Pulse 82   Temp 99 F (37.2 C)   Resp 16   Ht 5\' 5"  (1.651 m)   Wt 86.5 kg   LMP 10/21/2019   BMI 31.72 kg/m  No intake/output data recorded. No intake/output data recorded.  FHT:  FHR: 130 bpm, variability: moderate,  accelerations:  Present,  decelerations:  Absent UC:   regular, every 2-3 minutes SVE:   Dilation: 3 Effacement (%): 50 Station: -3 Exam by:: Dr. [email or web address]  Labs: Lab Results  Component Value Date   WBC 7.8 08/19/2020   HGB 10.5 (L) 08/19/2020   HCT 32.4 (L) 08/19/2020   MCV 87.6 08/19/2020   PLT 163 08/19/2020    Assessment / Plan: Induction of labor due to cholelithiasis ( symptomatic) ,  progressing well on pitocin  Labor: Progressing on Pitocin, will continue to increase then AROM Preeclampsia:  NA Fetal Wellbeing:  Category I Pain Control:  Planning epidural I/D:  n/a Anticipated MOD:  NSVD  CCOBMW and Dr. 13/07/2020 covering after 7 pm   Sallye Ober 08/19/2020, 5:11 PM

## 2020-08-20 ENCOUNTER — Encounter (HOSPITAL_COMMUNITY): Payer: Self-pay | Admitting: Obstetrics and Gynecology

## 2020-08-20 MED ORDER — OXYCODONE HCL 5 MG PO TABS: 5.0000 mg | ORAL_TABLET | ORAL | Status: DC | PRN

## 2020-08-20 MED ORDER — OXYCODONE HCL 5 MG PO TABS: 10.0000 mg | ORAL_TABLET | ORAL | Status: DC | PRN

## 2020-08-20 MED ORDER — COCONUT OIL OIL: 1.0000 "application " | TOPICAL_OIL | Status: DC | PRN

## 2020-08-20 MED ORDER — SENNOSIDES-DOCUSATE SODIUM 8.6-50 MG PO TABS
2.0000 | ORAL_TABLET | ORAL | Status: DC
  Administered 2020-08-20 – 2020-08-21 (×2): 2 via ORAL
  Filled 2020-08-20 (×2): qty 2

## 2020-08-20 MED ORDER — BUPIVACAINE HCL (PF) 0.25 % IJ SOLN
INTRAMUSCULAR | Status: DC | PRN
  Administered 2020-08-19: 8 mL via EPIDURAL

## 2020-08-20 MED ORDER — ACETAMINOPHEN 325 MG PO TABS
650.0000 mg | ORAL_TABLET | ORAL | Status: DC | PRN
  Administered 2020-08-20 – 2020-08-21 (×3): 650 mg via ORAL
  Filled 2020-08-20 (×3): qty 2

## 2020-08-20 MED ORDER — BENZOCAINE-MENTHOL 20-0.5 % EX AERO
1.0000 "application " | INHALATION_SPRAY | CUTANEOUS | Status: DC | PRN
  Administered 2020-08-20: 1 via TOPICAL
  Filled 2020-08-20 (×2): qty 56

## 2020-08-20 MED ORDER — PROMETHAZINE HCL 25 MG PO TABS
25.0000 mg | ORAL_TABLET | Freq: Three times a day (TID) | ORAL | Status: DC | PRN
  Filled 2020-08-20: qty 1

## 2020-08-20 MED ORDER — PRENATAL MULTIVITAMIN CH
1.0000 | ORAL_TABLET | Freq: Every day | ORAL | Status: DC
  Administered 2020-08-21 – 2020-08-22 (×2): 1 via ORAL
  Filled 2020-08-20 (×2): qty 1

## 2020-08-20 MED ORDER — WITCH HAZEL-GLYCERIN EX PADS: 1.0000 "application " | MEDICATED_PAD | CUTANEOUS | Status: DC | PRN

## 2020-08-20 MED ORDER — DIPHENHYDRAMINE HCL 25 MG PO CAPS: 25.0000 mg | ORAL_CAPSULE | Freq: Four times a day (QID) | ORAL | Status: DC | PRN

## 2020-08-20 MED ORDER — FAMOTIDINE 20 MG PO TABS
10.0000 mg | ORAL_TABLET | Freq: Every day | ORAL | Status: DC
  Administered 2020-08-22: 10 mg via ORAL
  Filled 2020-08-20: qty 1

## 2020-08-20 MED ORDER — ONDANSETRON HCL 4 MG PO TABS: 4.0000 mg | ORAL_TABLET | ORAL | Status: DC | PRN

## 2020-08-20 MED ORDER — ZOLPIDEM TARTRATE 5 MG PO TABS: 5.0000 mg | ORAL_TABLET | Freq: Every evening | ORAL | Status: DC | PRN

## 2020-08-20 MED ORDER — METHYLERGONOVINE MALEATE 0.2 MG PO TABS: 0.2000 mg | ORAL_TABLET | ORAL | Status: DC | PRN

## 2020-08-20 MED ORDER — DIBUCAINE (PERIANAL) 1 % EX OINT: 1.0000 "application " | TOPICAL_OINTMENT | CUTANEOUS | Status: DC | PRN

## 2020-08-20 MED ORDER — ONDANSETRON HCL 4 MG/2ML IJ SOLN: 4.0000 mg | INTRAMUSCULAR | Status: DC | PRN

## 2020-08-20 MED ORDER — FENTANYL CITRATE (PF) 100 MCG/2ML IJ SOLN
INTRAMUSCULAR | Status: DC | PRN
  Administered 2020-08-19: 100 ug via INTRAVENOUS

## 2020-08-20 MED ORDER — SIMETHICONE 80 MG PO CHEW: 80.0000 mg | CHEWABLE_TABLET | ORAL | Status: DC | PRN

## 2020-08-20 MED ORDER — IBUPROFEN 600 MG PO TABS
600.0000 mg | ORAL_TABLET | Freq: Four times a day (QID) | ORAL | Status: DC
  Administered 2020-08-20 – 2020-08-22 (×8): 600 mg via ORAL
  Filled 2020-08-20 (×8): qty 1

## 2020-08-20 MED ORDER — METHYLERGONOVINE MALEATE 0.2 MG/ML IJ SOLN: 0.2000 mg | INTRAMUSCULAR | Status: DC | PRN
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
## 2020-08-20 NOTE — Progress Notes (Addendum)
RN placed a latex foley catheter at 2230 using sterile technique. Due to pt allergy, no betadine used. Replaced by Chlorhexidine solution. Shortly afterwards pt complains of "vagina burning and feels like my epidural is not working there." Pt repositioned for comfort, RN to reassess pain. Pt called out stating "increased burning and sharp pain in low abdomen."  RN removed catheter and rinsed with sterile NS. 50mg /ml Benadryl given IV and 500cc bolus initiated via verbal order from on call CNM. Pt repositioned again and epidural PCA initiated. Upon Reassessment pt flushed, diaphoretic and tearful, Pt states the "burning" has increased and is not experiencing any relief. OB Anesthesia called to bedside. Epidural re-dosed and pt repositioned to high fowlers. RN and MD remain at bedside to assess. Pt noted to be feeling some relief and RN will continue to monitor. Pt expressed urge to urinate and RN encouraged due to no foley insertion. 1 large unmeasured urine occurrence noted. Pt states "feeling much better after urination." Verbal order received from CNM on call to use of bedpan q2hours. RN will continue to monitor.

## 2020-08-20 NOTE — Lactation Note (Signed)
This note was copied from a baby's chart. Lactation Consultation Note  Patient Name: Lisa Benton [id number] Date: 08/20/2020 Reason for consult: Initial assessment;Mother's request;Difficult latch;Primapara;1st time breastfeeding;Term;Other (Comment) (Right Clavicle fracture and cephalohematoma)  Infant 39 weeks 12 hours old. Mom able to latch infant a few times at the breast 14 minutes at 1420 feeding. Mom attempted at 1600 and and 2000 but not able to sustain a latch. Mom did hand expression and offered colostrum from the breasts into his mouth. LC arrived at 2200 and used heat, breast massage and hand expression to give drops of colostrum via spoon feeding. Infant then able to latch in football and cross cradle for 25 minutes with signs of milk transfer.   As per parents, infant had 1 stool at 2000 and no urine since birth.   Mom has a Leisure centre manager and manual pump.  Mom noted breast changes in pregnancy and had colostrum since 25 weeks leaking from the breasts.   Mom's breasts are soft and compressible but infant able to latch with support.   Plan 1. To feed based on cues 8-12x in 24 hour period no more than 4 hours without an attempt. Mom to place infant STS and look for signs of milk transfer.        2. Mom to use hand expression and massage to get colostrum as an appetizer before latching.         3. I and O sheet reviewed with parents.          4. LC brochure of inpatient and outpatient services reviewed.

## 2020-08-21 LAB — CBC
HCT: 27.5 % — ABNORMAL LOW (ref 36.0–46.0)
Hemoglobin: 8.8 g/dL — ABNORMAL LOW (ref 12.0–15.0)
MCH: 28.1 pg (ref 26.0–34.0)
MCHC: 32 g/dL (ref 30.0–36.0)
MCV: 87.9 fL (ref 80.0–100.0)
Platelets: 128 10*3/uL — ABNORMAL LOW (ref 150–400)
RBC: 3.13 MIL/uL — ABNORMAL LOW (ref 3.87–5.11)
RDW: 13.8 % (ref 11.5–15.5)
WBC: 9.5 10*3/uL (ref 4.0–10.5)
nRBC: 0 % (ref 0.0–0.2)

## 2020-08-21 MED ORDER — RHO D IMMUNE GLOBULIN 1500 UNIT/2ML IJ SOSY
300.0000 ug | PREFILLED_SYRINGE | Freq: Once | INTRAMUSCULAR | Status: AC
  Administered 2020-08-21: 300 ug via INTRAVENOUS
  Filled 2020-08-21: qty 2

## 2020-08-21 MED ORDER — FERROUS SULFATE 325 (65 FE) MG PO TABS
325.0000 mg | ORAL_TABLET | Freq: Every day | ORAL | Status: DC
  Administered 2020-08-22: 325 mg via ORAL
  Filled 2020-08-21: qty 1

## 2020-08-21 MED ORDER — INFLUENZA VAC SUBUNIT QUAD 0.5 ML IM SUSY
0.5000 mL | PREFILLED_SYRINGE | INTRAMUSCULAR | Status: AC
  Administered 2020-08-22: 0.5 mL via INTRAMUSCULAR
  Filled 2020-08-21: qty 0.5

## 2020-08-21 MED ORDER — FERROUS SULFATE 325 (65 FE) MG PO TABS
325.0000 mg | ORAL_TABLET | Freq: Every day | ORAL | 3 refills | Status: DC
Start: 2020-08-22 — End: 2020-09-07

## 2020-08-21 MED ORDER — INFLUENZA VAC SPLIT QUAD 0.5 ML IM SUSY: 0.5000 mL | PREFILLED_SYRINGE | INTRAMUSCULAR | Status: DC

## 2020-08-21 MED ORDER — IBUPROFEN 800 MG PO TABS: 800.0000 mg | ORAL_TABLET | Freq: Three times a day (TID) | ORAL | 0 refills | Status: DC | PRN

## 2020-08-21 NOTE — Anesthesia Postprocedure Evaluation (Signed)
Anesthesia Post Note  Patient: Lisa Benton  Procedure(s) Performed: AN AD HOC LABOR EPIDURAL     Patient location during evaluation: Mother Baby Anesthesia Type: Epidural Level of consciousness: awake and alert, oriented and patient cooperative Pain management: pain level controlled Vital Signs Assessment: post-procedure vital signs reviewed and stable Respiratory status: spontaneous breathing Cardiovascular status: stable Postop Assessment: no headache, epidural receding, patient able to bend at knees and no signs of nausea or vomiting Anesthetic complications: no Comments: Pt. States she is walking. Pain score 5.  Pt. Encouraged to communicate pain score to bedside RN.    No complications documented.  Last Vitals:  Vitals:   08/21/20 0057 08/21/20 0520  BP: 109/76 103/65  Pulse: 89 71  Resp: 16 18  Temp: 36.8 C 36.7 C  SpO2: 100% 98%    Last Pain:  Vitals:   08/21/20 0715  TempSrc:   PainSc: Asleep   Pain Goal: Patients Stated Pain Goal: 4 (08/21/20 0057)                 Merrilyn Puma

## 2020-08-21 NOTE — Progress Notes (Signed)
Postpartum Note Day #1  S:  Patient doing well.  Pain controlled.  Tolerating regular diet.   Ambulating and voiding without difficulty.  Reports perineal soreness.  She does not feel ready for discharge today and would like to go home tomorrow.  Denies fevers, chills, chest pain, SOB, N/V, or worsening bilateral LE edema.  Lochia: Minimal Infant feeding:  Breast Circumcision:  Desires  O: Temp:  [98.1 F (36.7 C)-99.1 F (37.3 C)] 98.1 F (36.7 C) (11/12 0520) Pulse Rate:  [71-126] 71 (11/12 0520) Resp:  [14-18] 18 (11/12 0520) BP: (103-137)/(63-105) 103/65 (11/12 0520) SpO2:  [98 %-100 %] 98 % (11/12 0520) Gen: NAD, pleasant and cooperative Pulm:  No increased work of breathing Abdomen: soft, non-distended, non-tender throughout Uterus: firm, non-tender, below umbilicus Ext: Trace bilateral LE edema, no bilateral calf tenderness  Labs: Recent Labs    08/19/20 0046 08/21/20 0526  HGB 10.5* 8.8*    A/P: Patient is a 29 y.o. [id number] PPD#1 s/p Vacuum Assisted Vaginal Delivery.  - Pain well controlled  - GU: UOP is adequate - GI: Tolerating regular diet - Activity: encouraged sitting up to chair and ambulation as tolerated - DVT Prophylaxis: SCDs - Labs: stable as above  Disposition:  PPD#2   Steva Ready, DO [phone] (office)

## 2020-08-22 DIAGNOSIS — K8020 Calculus of gallbladder without cholecystitis without obstruction: Secondary | ICD-10-CM

## 2020-08-22 LAB — RH IG WORKUP (INCLUDES ABO/RH)
ABO/RH(D): A NEG
Fetal Screen: NEGATIVE
Gestational Age(Wks): 39.1
Unit division: 0

## 2020-08-22 NOTE — Lactation Note (Signed)
This note was copied from a baby's chart. Lactation Consultation Note  Patient Name: Boy Jentri Aye [id number] Date: 08/22/2020   Infant is 27 hrs old & has had 5 voids & 10 stool since birth. He is almost 6% below BW. He is currently sleeping s/p circumcision.   Mom reports that he prefers to nurse in the football hold on both breasts (his broken clavicle tends not to bother him). I noted that Mom has very noticeable compression stripes on both nipples. She reports a difficult time getting his mouth open.  We unswaddled & unclothed infant to see if he would be willing to latch, but he was not. The lactation student assisted Mom with hand expression and the couple of drops were given to the infant on a gloved finger.   Mom will call out for latch assist when infant starts showing feeding cues again.      Lurline Hare [facility] 08/22/2020, 11:15 AM

## 2020-08-22 NOTE — Lactation Note (Addendum)
This note was copied from a baby's chart. Lactation Consultation Note  Patient Name: Lisa Benton [id number] Date: 08/22/2020   Parents called for me to return to room. Infant had not fed in 7 hrs and parents had spent the last hour trying to get infant to feed.   Parents were wondering if "[name]" had been doing well at the breast prior to the circumcision. I explained the difference in jaw movement when an infant is transferring milk versus not. The parents believed that, on the whole, the infant has not been transferring milk at the breast. This, in combination with the condition of Mom's nipples; the decreasing amount of colostrum available; lack of favorable LATCH scores; and infant not waking to feed led me to agree.   It is important to parents that infant is actually feeding. They are not opposed to using formula (they are to be d/c'd home soon). Formula was brought into the room and given with a bottle and an extra-slow flow nipple. Dad was taught how to hold infant and do paced feedings.  Infant took about 10 mL and fell asleep. I imagine that infant will soon wake up to consume more.  Mom has a Spectra pump at home. I encouraged her to express her milk every time infant receives formula & she agreed. Mom will likely need the size 24 flange. Parents know how to reach [place] for any post-discharge questions.   Lurline Hare [facility] 08/22/2020, 1:25 PM

## 2020-08-22 NOTE — Discharge Summary (Signed)
Postpartum Discharge Summary  08/22/20    Patient Name: Lisa Benton DOB: 05-24-1991 MRN: [id number]  Date of admission: 08/19/2020 Delivery date:08/20/2020  Delivering provider: Christophe Louis  Date of discharge: 08/22/2020  Admitting diagnosis: Term pregnancy [Z34.90] Intrauterine pregnancy: [redacted]w[redacted]d    Secondary diagnosis:  Active Problems:   Term pregnancy   Cholelithiasis  Additional problems: None   Discharge diagnosis: Term Pregnancy Delivered and Cholelithiasis                                              Post partum procedures:None Augmentation: AROM, Pitocin and Cytotec Complications: Shoulder dystocia  Hospital course: Induction of Labor With Vaginal Delivery   29y.o. yo [id number] admitted to the hospital 08/19/2020 for induction of labor.  Indication for induction: Elective and Symptomatic cholelithiasis.  Patient had an uncomplicated labor course as follows: Membrane Rupture Time/Date: 10:32 PM ,08/19/2020   Delivery Method:Vaginal, Vacuum (Extractor)  Episiotomy: None  Lacerations:  2nd degree  Details of delivery can be found in separate delivery note.  Patient had a routine postpartum course. Patient is discharged home 08/22/20.  Newborn Data: Birth date:08/20/2020  Birth time:10:27 AM  Gender:Female  Living status:Living  Apgars:6 ,9  Weight:3776 g   Magnesium Sulfate received: No BMZ received: No Rhophylac:No MMR:No T-DaP:Given prenatally Flu: Unknown Transfusion:No  Physical exam  Vitals:   08/21/20 1435 08/21/20 2047 08/21/20 2200 08/22/20 0601  BP: 122/85 106/69 98/69 104/71  Pulse: 67 62 63 65  Resp: '18 16  18  ' Temp: 98.5 F (36.9 C) 98.4 F (36.9 C) 99.2 F (37.3 C) 98.6 F (37 C)  TempSrc: Oral Oral Oral Oral  SpO2: 100% 99% 100% 100%  Weight:      Height:       General: alert, cooperative and no distress Lochia: appropriate Uterine Fundus: firm Incision: N/A DVT Evaluation: No evidence of DVT seen on physical exam. No  cords or calf tenderness. Labs: Lab Results  Component Value Date   WBC 9.5 08/21/2020   HGB 8.8 (L) 08/21/2020   HCT 27.5 (L) 08/21/2020   MCV 87.9 08/21/2020   PLT 128 (L) 08/21/2020   CMP Latest Ref Rng & Units 07/18/2020  Glucose 70 - 99 mg/dL 120(H)  BUN 6 - 20 mg/dL 6  Creatinine 0.44 - 1.00 mg/dL 0.59  Sodium 135 - 145 mmol/L 137  Potassium 3.5 - 5.1 mmol/L 4.1  Chloride 98 - 111 mmol/L 107  CO2 22 - 32 mmol/L 19(L)  Calcium 8.9 - 10.3 mg/dL 8.2(L)  Total Protein 6.5 - 8.1 g/dL 5.7(L)  Total Bilirubin 0.3 - 1.2 mg/dL 1.3(H)  Alkaline Phos 38 - 126 U/L 162(H)  AST 15 - 41 U/L 108(H)  ALT 0 - 44 U/L 65(H)   EdFlavia Shippercore: Edinburgh Postnatal Depression Scale Screening Tool 08/21/2020  I have been able to laugh and see the funny side of things. 0  I have looked forward with enjoyment to things. 0  I have blamed myself unnecessarily when things went wrong. 2  I have been anxious or worried for no good reason. 2  I have felt scared or panicky for no good reason. 1  Things have been getting on top of me. 1  I have been so unhappy that I have had difficulty sleeping. 0  I have felt sad or miserable. 0  I have been  so unhappy that I have been crying. 0  The thought of harming myself has occurred to me. 0  Edinburgh Postnatal Depression Scale Total 6      After visit meds:  Allergies as of 08/22/2020      Reactions   Other Anaphylaxis, Other (See Comments)   preservatives Stinging or biting creatures (insects, jellyfish)   Penicillins Anaphylaxis   Shellfish Allergy Anaphylaxis, Rash   Cephalexin Rash, Swelling   Eggs Or Egg-derived Products Nausea And Vomiting, Other (See Comments)   Flu like symptoms- egg beaters okay   Sulfamethoxazole-trimethoprim Other (See Comments)   Secondary Yeast Infection      Medication List    TAKE these medications   DICLEGIS PO Take 1 tablet by mouth daily.   famotidine 10 MG tablet Commonly known as: PEPCID Take 10 mg by  mouth daily.   ferrous sulfate 325 (65 FE) MG tablet Take 1 tablet (325 mg total) by mouth daily with breakfast.   ibuprofen 800 MG tablet Commonly known as: ADVIL Take 1 tablet (800 mg total) by mouth every 8 (eight) hours as needed.   prenatal multivitamin Tabs tablet Take 1 tablet by mouth daily at 12 noon.   promethazine 25 MG tablet Commonly known as: PHENERGAN Take 1 tablet (25 mg total) by mouth every 8 (eight) hours as needed for nausea or vomiting.       Discharge home in stable condition Infant Feeding: Breast Infant Disposition:home with mother Discharge instruction: per After Visit Summary and Postpartum booklet. Activity: Advance as tolerated. Pelvic rest for 6 weeks.  Diet: routine diet Anticipated Birth Control: Unsure Postpartum Appointment:6 weeks Additional Postpartum F/U: None Future Appointments: Future Appointments  Date Time Provider [place]  09/04/2020 10:05 AM [facility]-SCREENING [facility] None   Follow up Visit:  Follow-up Information    Christophe Louis, MD Follow up in 6 week(s).   Specialty: Obstetrics and Gynecology Contact information: [address] [place] [address] [place] [address] [phone]                   08/22/2020 Drema Dallas, DO

## 2020-08-27 NOTE — Progress Notes (Signed)
[id number] - [place], [place] - [address] [place] [address] Phone: [phone] Fax: [phone]      Your procedure is scheduled on 09/07/2020.  Report to [facility] Main Entrance "A" at 06:30 A.M., and check in at the Admitting office.  Call this number if you have problems the morning of surgery:  [phone]  Call [phone] if you have any questions prior to your surgery date Monday-Friday 8am-4pm    Remember:  Do not eat after midnight the night before your surgery.  You may drink clear liquids until 05:30am  the morning of your surgery.   Clear liquids allowed are: Water, Non-Citrus Juices (without pulp), Carbonated Beverages, Clear Tea, Black Coffee Only, and Gatorade    Take these medicines the morning of surgery with A SIP OF WATER  Famotidine (Pepcid)  As of today, STOP taking any Aspirin (unless otherwise instructed by your surgeon) Aleve, Naproxen, Ibuprofen, Motrin, Advil, Goody's, BC's, all herbal medications, fish oil, and all vitamins.                       Do not wear jewelry, make up, or nail polish            Do not wear lotions, powders, perfumes/colognes, or deodorant.            Do not shave 48 hours prior to surgery.  Men may shave face and neck.            Do not bring valuables to the hospital.            [facility] is not responsible for any belongings or valuables.  Do NOT Smoke (Tobacco/Vaping) or drink Alcohol 24 hours prior to your procedure If you use a CPAP at night, you may bring all equipment for your overnight stay.   Contacts, glasses, dentures or bridgework may not be worn into surgery.      For patients admitted to the hospital, discharge time will be determined by your treatment team.   Patients discharged the day of surgery will not be allowed to drive home, and someone needs to stay with them for 24 hours.    Special instructions:   [place]- Preparing For  Surgery  Before surgery, you can play an important role. Because skin is not sterile, your skin needs to be as free of germs as possible. You can reduce the number of germs on your skin by washing with CHG (chlorahexidine gluconate) Soap before surgery.  CHG is an antiseptic cleaner which kills germs and bonds with the skin to continue killing germs even after washing.    Oral Hygiene is also important to reduce your risk of infection.  Remember - BRUSH YOUR TEETH THE MORNING OF SURGERY WITH YOUR REGULAR TOOTHPASTE  Please do not use if you have an allergy to CHG or antibacterial soaps. If your skin becomes reddened/irritated stop using the CHG.  Do not shave (including legs and underarms) for at least 48 hours prior to first CHG shower. It is OK to shave your face.  Please follow these instructions carefully.   1. Shower the NIGHT BEFORE SURGERY and the MORNING OF SURGERY with CHG Soap.   2. If you chose to wash your hair, wash your hair first as usual with your normal shampoo.  3. After you shampoo, rinse your hair and body thoroughly to remove the shampoo.  4. Use CHG as  you would any other liquid soap. You can apply CHG directly to the skin and wash gently with a scrungie or a clean washcloth.   5. Apply the CHG Soap to your body ONLY FROM THE NECK DOWN.  Do not use on open wounds or open sores. Avoid contact with your eyes, ears, mouth and genitals (private parts). Wash Face and genitals (private parts)  with your normal soap.   6. Wash thoroughly, paying special attention to the area where your surgery will be performed.  7. Thoroughly rinse your body with warm water from the neck down.  8. DO NOT shower/wash with your normal soap after using and rinsing off the CHG Soap.  9. Pat yourself dry with a CLEAN TOWEL.  10. Wear CLEAN PAJAMAS to bed the night before surgery  11. Place CLEAN SHEETS on your bed the night of your first shower and DO NOT SLEEP WITH PETS.   Day of  Surgery: Wear Clean/Comfortable clothing the morning of surgery Do not apply any deodorants/lotions.   Remember to brush your teeth WITH YOUR REGULAR TOOTHPASTE.   Please read over the following fact sheets that you were given.

## 2020-08-28 ENCOUNTER — Encounter (HOSPITAL_COMMUNITY): Payer: Self-pay

## 2020-08-28 ENCOUNTER — Encounter (HOSPITAL_COMMUNITY)
Admission: RE | Admit: 2020-08-28 | Discharge: 2020-08-28 | Disposition: A | Discharge status: Home / Self Care | Payer: Managed Care, Other (non HMO) | Source: Ambulatory Visit | Attending: General Surgery | Admitting: General Surgery

## 2020-08-28 ENCOUNTER — Other Ambulatory Visit: Payer: Self-pay

## 2020-08-28 DIAGNOSIS — Z01812 Encounter for preprocedural laboratory examination: Secondary | ICD-10-CM | POA: Insufficient documentation

## 2020-08-28 HISTORY — DX: Personal history of urinary calculi: Z87.442

## 2020-08-28 LAB — CBC
HCT: 39.5 % (ref 36.0–46.0)
Hemoglobin: 12.2 g/dL (ref 12.0–15.0)
MCH: 27.5 pg (ref 26.0–34.0)
MCHC: 30.9 g/dL (ref 30.0–36.0)
MCV: 89.2 fL (ref 80.0–100.0)
Platelets: 297 10*3/uL (ref 150–400)
RBC: 4.43 MIL/uL (ref 3.87–5.11)
RDW: 13.5 % (ref 11.5–15.5)
WBC: 5.3 10*3/uL (ref 4.0–10.5)
nRBC: 0 % (ref 0.0–0.2)

## 2020-08-28 LAB — BASIC METABOLIC PANEL
Anion gap: 13 (ref 5–15)
BUN: 9 mg/dL (ref 6–20)
CO2: 22 mmol/L (ref 22–32)
Calcium: 8.7 mg/dL — ABNORMAL LOW (ref 8.9–10.3)
Chloride: 105 mmol/L (ref 98–111)
Creatinine, Ser: 0.77 mg/dL (ref 0.44–1.00)
GFR, Estimated: >60 mL/min (ref >60)
Glucose, Bld: 95 mg/dL (ref 70–99)
Potassium: 3.8 mmol/L (ref 3.5–5.1)
Sodium: 140 mmol/L (ref 135–145)

## 2020-08-28 NOTE — Progress Notes (Signed)
PCP -  [facility] Cardiologist - Denies  PPM/ICD - Denies  Chest x-ray - N/A EKG - N/A Stress Test - Denies ECHO - Denies Cardiac Cath - Denies  Sleep Study - Denies  Patient denies having diabetes.  Blood Thinner Instructions: N/A Aspirin Instructions: N/A  ERAS Protcol - Yes PRE-SURGERY Ensure or G2- No  COVID TEST- 09/04/20   Coronavirus Screening  Have you experienced the following symptoms:  Cough yes/no: No Fever (>100.27F)  yes/no: No Runny nose yes/no: No Sore throat yes/no: No Difficulty breathing/shortness of breath  yes/no: No  Have you or a family member traveled in the last 14 days and where? yes/no: No   If the patient indicates "YES" to the above questions, their PAT will be rescheduled to limit the exposure to others and, the surgeon will be notified. THE PATIENT WILL NEED TO BE ASYMPTOMATIC FOR 14 DAYS.   If the patient is not experiencing any of these symptoms, the PAT nurse will instruct them to NOT bring anyone with them to their appointment since they may have these symptoms or traveled as well.   Please remind your patients and families that hospital visitation restrictions are in effect and the importance of the restrictions.     Anesthesia review: No  Patient denies shortness of breath, fever, cough and chest pain at PAT appointment   All instructions explained to the patient, with a verbal understanding of the material. Patient agrees to go over the instructions while at home for a better understanding. Patient also instructed to self quarantine after being tested for COVID-19. The opportunity to ask questions was provided.

## 2020-08-28 NOTE — Progress Notes (Signed)
[id number] - [place] - [address] [place] [address] Phone: [phone] Fax: [phone]      Your procedure is scheduled on 09/07/2020.  Report to [facility] Main Entrance "A" at 06:30 A.M., and check in at the Admitting office.  Call this number if you have problems the morning of surgery:  [phone]  Call [phone] if you have any questions prior to your surgery date Monday-Friday 8am-4pm    Remember:  Do not eat after midnight the night before your surgery.  You may drink clear liquids until 05:30am  the morning of your surgery.   Clear liquids allowed are: Water, Non-Citrus Juices (without pulp), Carbonated Beverages, Clear Tea, Black Coffee Only, and Gatorade    Take these medicines the morning of surgery with A SIP OF WATER      Famotidine (Pepcid) promethazine (PHENERGAN) - if needed  As of today, STOP taking any Aspirin (unless otherwise instructed by your surgeon) Aleve, Naproxen, Ibuprofen, Motrin, Advil, Goody's, BC's, all herbal medications, fish oil, and all vitamins.                       Do not wear jewelry, make up, or nail polish            Do not wear lotions, powders, perfumes, or deodorant.            Do not shave 48 hours prior to surgery.            Do not bring valuables to the hospital.            [facility] is not responsible for any belongings or valuables.  Do NOT Smoke (Tobacco/Vaping) or drink Alcohol 24 hours prior to your procedure If you use a CPAP at night, you may bring all equipment for your overnight stay.   Contacts, glasses, dentures or bridgework may not be worn into surgery.      For patients admitted to the hospital, discharge time will be determined by your treatment team.   Patients discharged the day of surgery will not be allowed to drive home, and someone needs to stay with them for 24 hours.    Special instructions:   [facility]- Preparing For  Surgery  Before surgery, you can play an important role. Because skin is not sterile, your skin needs to be as free of germs as possible. You can reduce the number of germs on your skin by washing with CHG (chlorahexidine gluconate) Soap before surgery.  CHG is an antiseptic cleaner which kills germs and bonds with the skin to continue killing germs even after washing.    Oral Hygiene is also important to reduce your risk of infection.  Remember - BRUSH YOUR TEETH THE MORNING OF SURGERY WITH YOUR REGULAR TOOTHPASTE  Please do not use if you have an allergy to CHG or antibacterial soaps. If your skin becomes reddened/irritated stop using the CHG.  Do not shave (including legs and underarms) for at least 48 hours prior to first CHG shower. It is OK to shave your face.  Please follow these instructions carefully.   1. Shower the NIGHT BEFORE SURGERY and the MORNING OF SURGERY with CHG Soap.   2. If you chose to wash your hair, wash your hair first as usual with your normal shampoo.  3. After you shampoo, rinse your hair and body thoroughly to remove the shampoo.  4. Use  CHG as you would any other liquid soap. You can apply CHG directly to the skin and wash gently with a scrungie or a clean washcloth.   5. Apply the CHG Soap to your body ONLY FROM THE NECK DOWN.  Do not use on open wounds or open sores. Avoid contact with your eyes, ears, mouth and genitals (private parts). Wash Face and genitals (private parts)  with your normal soap.   6. Wash thoroughly, paying special attention to the area where your surgery will be performed.  7. Thoroughly rinse your body with warm water from the neck down.  8. DO NOT shower/wash with your normal soap after using and rinsing off the CHG Soap.  9. Pat yourself dry with a CLEAN TOWEL.  10. Wear CLEAN PAJAMAS to bed the night before surgery  11. Place CLEAN SHEETS on your bed the night of your first shower and DO NOT SLEEP WITH PETS.   Day of  Surgery: Wear Clean/Comfortable clothing the morning of surgery Do not apply any deodorants/lotions.   Remember to brush your teeth WITH YOUR REGULAR TOOTHPASTE.   Please read over the following fact sheets that you were given.

## 2020-09-04 ENCOUNTER — Other Ambulatory Visit (HOSPITAL_COMMUNITY)
Admission: RE | Admit: 2020-09-04 | Discharge: 2020-09-04 | Disposition: A | Discharge status: Home / Self Care | Payer: Managed Care, Other (non HMO) | Source: Ambulatory Visit | Attending: General Surgery | Admitting: General Surgery

## 2020-09-04 DIAGNOSIS — Z01818 Encounter for other preprocedural examination: Secondary | ICD-10-CM | POA: Diagnosis present

## 2020-09-04 DIAGNOSIS — Z20822 Contact with and (suspected) exposure to covid-19: Secondary | ICD-10-CM | POA: Insufficient documentation

## 2020-09-04 LAB — SARS CORONAVIRUS 2 (TAT 6-24 HRS): SARS Coronavirus 2: NEGATIVE

## 2020-09-06 NOTE — Anesthesia Preprocedure Evaluation (Addendum)
Anesthesia Evaluation  Patient identified by MRN, date of birth, ID band Patient awake    Reviewed: Allergy & Precautions, NPO status , Patient's Chart, lab work & pertinent test results  History of Anesthesia Complications (+) PONV  Airway Mallampati: II  TM Distance: >3 FB Neck ROM: Full    Dental no notable dental hx. (+) Teeth Intact, Dental Advisory Given   Pulmonary neg pulmonary ROS,    Pulmonary exam normal breath sounds clear to auscultation       Cardiovascular Exercise Tolerance: Good negative cardio ROS Normal cardiovascular exam Rhythm:Regular Rate:Normal     Neuro/Psych  Headaches,    GI/Hepatic negative GI ROS, Neg liver ROS,   Endo/Other  negative endocrine ROS  Renal/GU K+ 3.8 Cr 0.77     Musculoskeletal negative musculoskeletal ROS (+)   Abdominal   Peds  Hematology negative hematology ROS (+) Hgb 12.2   Anesthesia Other Findings All PCN, sulfa, cephalexin  Reproductive/Obstetrics Recent pregnancy 11/10                            Anesthesia Physical Anesthesia Plan  ASA: II  Anesthesia Plan: General   Post-op Pain Management:    Induction: Intravenous  PONV Risk Score and Plan: 4 or greater and Treatment may vary due to age or medical condition, Ondansetron, Dexamethasone and Scopolamine patch - Pre-op  Airway Management Planned: Oral ETT  Additional Equipment: None  Intra-op Plan:   Post-operative Plan: Extubation in OR  Informed Consent: I have reviewed the patients History and Physical, chart, labs and discussed the procedure including the risks, benefits and alternatives for the proposed anesthesia with the patient or authorized representative who has indicated his/her understanding and acceptance.     Dental advisory given  Plan Discussed with: CRNA and Anesthesiologist  Anesthesia Plan Comments: (GA)       Anesthesia Quick Evaluation

## 2020-09-07 ENCOUNTER — Ambulatory Visit (HOSPITAL_COMMUNITY): Payer: Managed Care, Other (non HMO)

## 2020-09-07 ENCOUNTER — Ambulatory Visit (HOSPITAL_COMMUNITY): Payer: Managed Care, Other (non HMO) | Admitting: Certified Registered Nurse Anesthetist

## 2020-09-07 ENCOUNTER — Ambulatory Visit (HOSPITAL_COMMUNITY)
Admission: RE | Admit: 2020-09-07 | Discharge: 2020-09-07 | Disposition: A | Discharge status: Home / Self Care | Payer: Managed Care, Other (non HMO) | Source: Ambulatory Visit | Attending: General Surgery | Admitting: General Surgery

## 2020-09-07 ENCOUNTER — Encounter (HOSPITAL_COMMUNITY): Payer: Self-pay | Admitting: General Surgery

## 2020-09-07 ENCOUNTER — Other Ambulatory Visit: Payer: Self-pay

## 2020-09-07 ENCOUNTER — Encounter (HOSPITAL_COMMUNITY)
Admission: RE | Disposition: A | Discharge status: Home / Self Care | Payer: Self-pay | Source: Ambulatory Visit | Attending: General Surgery

## 2020-09-07 DIAGNOSIS — K811 Chronic cholecystitis: Secondary | ICD-10-CM | POA: Diagnosis not present

## 2020-09-07 DIAGNOSIS — Z880 Allergy status to penicillin: Secondary | ICD-10-CM | POA: Diagnosis not present

## 2020-09-07 DIAGNOSIS — O9963 Diseases of the digestive system complicating the puerperium: Secondary | ICD-10-CM | POA: Insufficient documentation

## 2020-09-07 DIAGNOSIS — K8020 Calculus of gallbladder without cholecystitis without obstruction: Secondary | ICD-10-CM | POA: Diagnosis present

## 2020-09-07 DIAGNOSIS — Z419 Encounter for procedure for purposes other than remedying health state, unspecified: Secondary | ICD-10-CM

## 2020-09-07 HISTORY — PX: CHOLECYSTECTOMY: SHX55

## 2020-09-07 LAB — POCT PREGNANCY, URINE: Preg Test, Ur: NEGATIVE

## 2020-09-07 SURGERY — LAPAROSCOPIC CHOLECYSTECTOMY WITH INTRAOPERATIVE CHOLANGIOGRAM
Anesthesia: General | Site: Abdomen

## 2020-09-07 MED ORDER — CHLORHEXIDINE GLUCONATE CLOTH 2 % EX PADS: 6.0000 | MEDICATED_PAD | Freq: Once | CUTANEOUS | Status: DC

## 2020-09-07 MED ORDER — LIDOCAINE HCL (PF) 2 % IJ SOLN
INTRAMUSCULAR | Status: AC
  Filled 2020-09-07: qty 5

## 2020-09-07 MED ORDER — GLYCOPYRROLATE PF 0.2 MG/ML IJ SOSY
PREFILLED_SYRINGE | INTRAMUSCULAR | Status: AC
  Filled 2020-09-07: qty 1

## 2020-09-07 MED ORDER — HYDROMORPHONE HCL 1 MG/ML IJ SOLN
INTRAMUSCULAR | Status: AC
  Filled 2020-09-07: qty 1

## 2020-09-07 MED ORDER — SODIUM CHLORIDE (PF) 0.9 % IJ SOLN
INTRAMUSCULAR | Status: AC
  Filled 2020-09-07: qty 50

## 2020-09-07 MED ORDER — LACTATED RINGERS IV SOLN: INTRAVENOUS | Status: DC

## 2020-09-07 MED ORDER — SCOPOLAMINE 1 MG/3DAYS TD PT72
MEDICATED_PATCH | TRANSDERMAL | Status: AC
  Filled 2020-09-07: qty 1

## 2020-09-07 MED ORDER — SCOPOLAMINE 1 MG/3DAYS TD PT72
1.0000 | MEDICATED_PATCH | TRANSDERMAL | Status: DC
  Administered 2020-09-07: 1 via TRANSDERMAL

## 2020-09-07 MED ORDER — DEXMEDETOMIDINE (PRECEDEX) IN NS 20 MCG/5ML (4 MCG/ML) IV SYRINGE
PREFILLED_SYRINGE | INTRAVENOUS | Status: DC | PRN
  Administered 2020-09-07: 8 ug via INTRAVENOUS
  Administered 2020-09-07: 12 ug via INTRAVENOUS

## 2020-09-07 MED ORDER — HYDROMORPHONE HCL 1 MG/ML IJ SOLN
INTRAMUSCULAR | Status: DC | PRN
Start: 2020-09-07 — End: 2020-09-07
  Administered 2020-09-07 (×2): .5 mg via INTRAVENOUS

## 2020-09-07 MED ORDER — PROPOFOL 10 MG/ML IV BOLUS
INTRAVENOUS | Status: AC
  Filled 2020-09-07: qty 20

## 2020-09-07 MED ORDER — GABAPENTIN 300 MG PO CAPS
300.0000 mg | ORAL_CAPSULE | ORAL | Status: AC
  Administered 2020-09-07: 300 mg via ORAL
  Filled 2020-09-07: qty 1

## 2020-09-07 MED ORDER — KETOROLAC TROMETHAMINE 30 MG/ML IJ SOLN
30.0000 mg | Freq: Once | INTRAMUSCULAR | Status: AC | PRN
  Administered 2020-09-07: 30 mg via INTRAVENOUS

## 2020-09-07 MED ORDER — DEXMEDETOMIDINE (PRECEDEX) IN NS 20 MCG/5ML (4 MCG/ML) IV SYRINGE
PREFILLED_SYRINGE | INTRAVENOUS | Status: AC
  Filled 2020-09-07: qty 5

## 2020-09-07 MED ORDER — ROCURONIUM BROMIDE 10 MG/ML (PF) SYRINGE
PREFILLED_SYRINGE | INTRAVENOUS | Status: AC
  Filled 2020-09-07: qty 10

## 2020-09-07 MED ORDER — DEXAMETHASONE SODIUM PHOSPHATE 10 MG/ML IJ SOLN
INTRAMUSCULAR | Status: AC
  Filled 2020-09-07: qty 1

## 2020-09-07 MED ORDER — KETOROLAC TROMETHAMINE 30 MG/ML IJ SOLN
INTRAMUSCULAR | Status: AC
  Filled 2020-09-07: qty 1

## 2020-09-07 MED ORDER — SODIUM CHLORIDE 0.9 % IR SOLN
Status: DC | PRN
  Administered 2020-09-07: 1000 mL

## 2020-09-07 MED ORDER — 0.9 % SODIUM CHLORIDE (POUR BTL) OPTIME
TOPICAL | Status: DC | PRN
  Administered 2020-09-07: 1000 mL

## 2020-09-07 MED ORDER — CHLORHEXIDINE GLUCONATE 0.12 % MT SOLN
15.0000 mL | Freq: Once | OROMUCOSAL | Status: AC
  Administered 2020-09-07: 15 mL via OROMUCOSAL
  Filled 2020-09-07: qty 15

## 2020-09-07 MED ORDER — ORAL CARE MOUTH RINSE: 15.0000 mL | Freq: Once | OROMUCOSAL | Status: AC

## 2020-09-07 MED ORDER — ONDANSETRON HCL 4 MG/2ML IJ SOLN
INTRAMUSCULAR | Status: AC
  Filled 2020-09-07: qty 2

## 2020-09-07 MED ORDER — DEXAMETHASONE SODIUM PHOSPHATE 10 MG/ML IJ SOLN
INTRAMUSCULAR | Status: DC | PRN
  Administered 2020-09-07: 10 mg via INTRAVENOUS

## 2020-09-07 MED ORDER — OXYCODONE HCL 5 MG/5ML PO SOLN: 5.0000 mg | Freq: Once | ORAL | Status: AC | PRN

## 2020-09-07 MED ORDER — HYDROMORPHONE HCL 1 MG/ML IJ SOLN
0.2500 mg | INTRAMUSCULAR | Status: DC | PRN
  Administered 2020-09-07 (×2): 0.25 mg via INTRAVENOUS
  Administered 2020-09-07: 0.5 mg via INTRAVENOUS

## 2020-09-07 MED ORDER — FENTANYL CITRATE (PF) 250 MCG/5ML IJ SOLN
INTRAMUSCULAR | Status: AC
  Filled 2020-09-07: qty 5

## 2020-09-07 MED ORDER — ONDANSETRON HCL 4 MG/2ML IJ SOLN
4.0000 mg | Freq: Once | INTRAMUSCULAR | Status: AC | PRN
  Administered 2020-09-07: 4 mg via INTRAVENOUS

## 2020-09-07 MED ORDER — SODIUM CHLORIDE 0.9 % IV SOLN
INTRAVENOUS | Status: DC | PRN
  Administered 2020-09-07: 5 mL

## 2020-09-07 MED ORDER — HYDROCODONE-ACETAMINOPHEN 5-325 MG PO TABS: 1.0000 | ORAL_TABLET | Freq: Four times a day (QID) | ORAL | 0 refills | Status: DC | PRN

## 2020-09-07 MED ORDER — PROPOFOL 10 MG/ML IV BOLUS
INTRAVENOUS | Status: DC | PRN
  Administered 2020-09-07: 130 mg via INTRAVENOUS

## 2020-09-07 MED ORDER — KETOROLAC TROMETHAMINE 30 MG/ML IJ SOLN: 30.0000 mg | Freq: Once | INTRAMUSCULAR | Status: AC

## 2020-09-07 MED ORDER — MIDAZOLAM HCL 2 MG/2ML IJ SOLN
INTRAMUSCULAR | Status: AC
  Filled 2020-09-07: qty 2

## 2020-09-07 MED ORDER — SUGAMMADEX SODIUM 200 MG/2ML IV SOLN
INTRAVENOUS | Status: DC | PRN
  Administered 2020-09-07 (×2): 100 mg via INTRAVENOUS

## 2020-09-07 MED ORDER — OXYCODONE HCL 5 MG PO TABS
ORAL_TABLET | ORAL | Status: AC
  Filled 2020-09-07: qty 1

## 2020-09-07 MED ORDER — OXYCODONE HCL 5 MG PO TABS
5.0000 mg | ORAL_TABLET | Freq: Once | ORAL | Status: AC | PRN
  Administered 2020-09-07: 5 mg via ORAL

## 2020-09-07 MED ORDER — LIDOCAINE 2% (20 MG/ML) 5 ML SYRINGE
INTRAMUSCULAR | Status: DC | PRN
  Administered 2020-09-07: 100 mg via INTRAVENOUS

## 2020-09-07 MED ORDER — ROCURONIUM BROMIDE 10 MG/ML (PF) SYRINGE
PREFILLED_SYRINGE | INTRAVENOUS | Status: DC | PRN
  Administered 2020-09-07: 50 mg via INTRAVENOUS

## 2020-09-07 MED ORDER — BUPIVACAINE-EPINEPHRINE 0.25% -1:200000 IJ SOLN
INTRAMUSCULAR | Status: DC | PRN
  Administered 2020-09-07: 20 mL

## 2020-09-07 MED ORDER — AMISULPRIDE (ANTIEMETIC) 5 MG/2ML IV SOLN: 10.0000 mg | Freq: Once | INTRAVENOUS | Status: DC | PRN

## 2020-09-07 MED ORDER — CIPROFLOXACIN IN D5W 400 MG/200ML IV SOLN
400.0000 mg | INTRAVENOUS | Status: AC
  Administered 2020-09-07: 400 mg via INTRAVENOUS
  Filled 2020-09-07: qty 200

## 2020-09-07 MED ORDER — BUPIVACAINE HCL (PF) 0.25 % IJ SOLN
INTRAMUSCULAR | Status: AC
  Filled 2020-09-07: qty 30

## 2020-09-07 MED ORDER — GLYCOPYRROLATE PF 0.2 MG/ML IJ SOSY
PREFILLED_SYRINGE | INTRAMUSCULAR | Status: DC | PRN
  Administered 2020-09-07 (×2): .1 mg via INTRAVENOUS

## 2020-09-07 MED ORDER — ACETAMINOPHEN 500 MG PO TABS
1000.0000 mg | ORAL_TABLET | ORAL | Status: AC
  Administered 2020-09-07: 1000 mg via ORAL
  Filled 2020-09-07: qty 2

## 2020-09-07 SURGICAL SUPPLY — 35 items
APPLIER CLIP 5 13 M/L LIGAMAX5 (MISCELLANEOUS) ×3
BLADE CLIPPER SURG (BLADE) IMPLANT
CANISTER SUCT 3000ML PPV (MISCELLANEOUS) ×3 IMPLANT
CATH REDDICK CHOLANGI 4FR 50CM (CATHETERS) ×3 IMPLANT
CHLORAPREP W/TINT 26 (MISCELLANEOUS) ×3 IMPLANT
CLIP APPLIE 5 13 M/L LIGAMAX5 (MISCELLANEOUS) ×1 IMPLANT
COVER MAYO STAND STRL (DRAPES) ×3 IMPLANT
COVER SURGICAL LIGHT HANDLE (MISCELLANEOUS) ×3 IMPLANT
COVER WAND RF STERILE (DRAPES) ×3 IMPLANT
DERMABOND ADVANCED (GAUZE/BANDAGES/DRESSINGS) ×2
DERMABOND ADVANCED .7 DNX12 (GAUZE/BANDAGES/DRESSINGS) ×1 IMPLANT
DRAPE C-ARM 42X120 X-RAY (DRAPES) ×3 IMPLANT
ELECT REM PT RETURN 9FT ADLT (ELECTROSURGICAL) ×3
ELECTRODE REM PT RTRN 9FT ADLT (ELECTROSURGICAL) ×1 IMPLANT
GLOVE BIO SURGEON STRL SZ7.5 (GLOVE) ×3 IMPLANT
GOWN STRL REUS W/ TWL LRG LVL3 (GOWN DISPOSABLE) ×3 IMPLANT
GOWN STRL REUS W/TWL LRG LVL3 (GOWN DISPOSABLE) ×6
IV CATH 14GX2 1/4 (CATHETERS) ×3 IMPLANT
KIT BASIN OR (CUSTOM PROCEDURE TRAY) ×3 IMPLANT
KIT TURNOVER KIT B (KITS) ×3 IMPLANT
NS IRRIG 1000ML POUR BTL (IV SOLUTION) ×3 IMPLANT
PAD ARMBOARD 7.5X6 YLW CONV (MISCELLANEOUS) ×3 IMPLANT
POUCH SPECIMEN RETRIEVAL 10MM (ENDOMECHANICALS) ×3 IMPLANT
SCISSORS LAP 5X35 DISP (ENDOMECHANICALS) ×3 IMPLANT
SET IRRIG TUBING LAPAROSCOPIC (IRRIGATION / IRRIGATOR) ×3 IMPLANT
SET TUBE SMOKE EVAC HIGH FLOW (TUBING) ×3 IMPLANT
SLEEVE ENDOPATH XCEL 5M (ENDOMECHANICALS) ×6 IMPLANT
SPECIMEN JAR SMALL (MISCELLANEOUS) ×3 IMPLANT
SUT MNCRL AB 4-0 PS2 18 (SUTURE) ×3 IMPLANT
TOWEL GREEN STERILE (TOWEL DISPOSABLE) ×3 IMPLANT
TOWEL GREEN STERILE FF (TOWEL DISPOSABLE) ×3 IMPLANT
TRAY LAPAROSCOPIC MC (CUSTOM PROCEDURE TRAY) ×3 IMPLANT
TROCAR XCEL BLUNT TIP 100MML (ENDOMECHANICALS) ×3 IMPLANT
TROCAR XCEL NON-BLD 5MMX100MML (ENDOMECHANICALS) ×3 IMPLANT
WATER STERILE IRR 1000ML POUR (IV SOLUTION) ×3 IMPLANT

## 2020-09-07 NOTE — Anesthesia Procedure Notes (Signed)
Procedure Name: Intubation Date/Time: 09/07/2020 8:37 AM Performed by: Valda Favia, CRNA Pre-anesthesia Checklist: Patient identified, Emergency Drugs available, Suction available and Patient being monitored Patient Re-evaluated:Patient Re-evaluated prior to induction Oxygen Delivery Method: Circle System Utilized Preoxygenation: Pre-oxygenation with 100% oxygen Induction Type: IV induction Ventilation: Mask ventilation without difficulty Laryngoscope Size: Mac and 3 Grade View: Grade I Tube type: Oral Tube size: 7.0 mm Number of attempts: 1 Airway Equipment and Method: Stylet and Oral airway Placement Confirmation: ETT inserted through vocal cords under direct vision,  positive ETCO2 and breath sounds checked- equal and bilateral Secured at: 21 cm Tube secured with: Tape Dental Injury: Teeth and Oropharynx as per pre-operative assessment  Comments: Performed by Sueanne Margarita, SRNA

## 2020-09-07 NOTE — Interval H&P Note (Signed)
History and Physical Interval Note:  09/07/2020 8:10 AM  Lisa Benton  has presented today for surgery, with the diagnosis of gallstones.  The various methods of treatment have been discussed with the patient and family. After consideration of risks, benefits and other options for treatment, the patient has consented to  Procedure(s): LAPAROSCOPIC CHOLECYSTECTOMY WITH INTRAOPERATIVE CHOLANGIOGRAM (N/A) as a surgical intervention.  The patient's history has been reviewed, patient examined, no change in status, stable for surgery.  I have reviewed the patient's chart and labs.  Questions were answered to the patient's satisfaction.     Chevis Pretty III

## 2020-09-07 NOTE — Anesthesia Postprocedure Evaluation (Signed)
Anesthesia Post Note  Patient: Lisa Benton  Procedure(s) Performed: LAPAROSCOPIC CHOLECYSTECTOMY WITH INTRAOPERATIVE CHOLANGIOGRAM (N/A Abdomen)     Patient location during evaluation: PACU Anesthesia Type: General Level of consciousness: awake and alert Pain management: pain level controlled Vital Signs Assessment: post-procedure vital signs reviewed and stable Respiratory status: spontaneous breathing, nonlabored ventilation, respiratory function stable and patient connected to nasal cannula oxygen Cardiovascular status: blood pressure returned to baseline and stable Postop Assessment: no apparent nausea or vomiting Anesthetic complications: no   No complications documented.  Last Vitals:  Vitals:   09/07/20 1030 09/07/20 1045  BP: 113/83 120/70  Pulse: 87 62  Resp: 16 12  Temp:  36.4 C  SpO2: 96% 98%    Last Pain:  Vitals:   09/07/20 1045  TempSrc:   PainSc: Asleep                 Trevor Iha

## 2020-09-07 NOTE — Transfer of Care (Signed)
Immediate Anesthesia Transfer of Care Note  Patient: Lisa Benton  Procedure(s) Performed: LAPAROSCOPIC CHOLECYSTECTOMY WITH INTRAOPERATIVE CHOLANGIOGRAM (N/A Abdomen)  Patient Location: PACU  Anesthesia Type:General  Level of Consciousness: awake, alert  and oriented  Airway & Oxygen Therapy: Patient Spontanous Breathing and Patient connected to nasal cannula oxygen  Post-op Assessment: Report given to RN and Post -op Vital signs reviewed and stable  Post vital signs: Reviewed and stable  Last Vitals:  Vitals Value Taken Time  BP 127/87 09/07/20 0946  Temp 36.4 C 09/07/20 0945  Pulse 78 09/07/20 0952  Resp 11 09/07/20 0952  SpO2 100 % 09/07/20 0952  Vitals shown include unvalidated device data.  Last Pain:  Vitals:   09/07/20 0945  TempSrc:   PainSc: 9       Patients Stated Pain Goal: 3 (09/07/20 0945)  Complications: No complications documented.

## 2020-09-07 NOTE — Op Note (Signed)
09/07/2020  9:32 AM  PATIENT:  Lisa Benton  29 y.o. female  PRE-OPERATIVE DIAGNOSIS:  gallstones  POST-OPERATIVE DIAGNOSIS:  gallstones  PROCEDURE:  Procedure(s): LAPAROSCOPIC CHOLECYSTECTOMY WITH INTRAOPERATIVE CHOLANGIOGRAM (N/A)  SURGEON:  Surgeon(s) and Role:    * Griselda Miner, MD - Primary  PHYSICIAN ASSISTANT:   ASSISTANTS: Berenda Morale, RNFA   ANESTHESIA:   local and general  EBL:  10 mL   BLOOD ADMINISTERED:none  DRAINS: none   LOCAL MEDICATIONS USED:  MARCAINE     SPECIMEN:  Source of Specimen:  gallbladder  DISPOSITION OF SPECIMEN:  PATHOLOGY  COUNTS:  YES  TOURNIQUET:  * No tourniquets in log *  DICTATION: .Dragon Dictation     Procedure: After informed consent was obtained the patient was brought to the operating room and placed in the supine position on the operating room table. After adequate induction of general anesthesia the patient's abdomen was prepped with ChloraPrep allowed to dry and draped in usual sterile manner. An appropriate timeout was performed. The area below the umbilicus was infiltrated with quarter percent  Marcaine. A small incision was made with a 15 blade knife. The incision was carried down through the subcutaneous tissue bluntly with a hemostat and [employer] retractors. The linea alba was identified. The linea alba was incised with a 15 blade knife and each side was grasped with Coker clamps. The preperitoneal space was then probed with a hemostat until the peritoneum was opened and access was gained to the abdominal cavity. A 0 Vicryl pursestring stitch was placed in the fascia surrounding the opening. A Hassan cannula was then placed through the opening and anchored in place with the previously placed Vicryl purse string stitch. The abdomen was insufflated with carbon dioxide without difficulty. A laparoscope was inserted through the [facility] cannula in the right upper quadrant was inspected. Next the epigastric region was  infiltrated with % Marcaine. A small incision was made with a 15 blade knife. A 5 mm port was placed bluntly through this incision into the abdominal cavity under direct vision. Next 2 sites were chosen laterally on the right side of the abdomen for placement of 5 mm ports. Each of these areas was infiltrated with quarter percent Marcaine. Small stab incisions were made with a 15 blade knife. 5 mm ports were then placed bluntly through these incisions into the abdominal cavity under direct vision without difficulty. A blunt grasper was placed through the lateralmost 5 mm port and used to grasp the dome of the gallbladder and elevated anteriorly and superiorly. Another blunt grasper was placed through the other 5 mm port and used to retract the body and neck of the gallbladder. A dissector was placed through the epigastric port and using the electrocautery the peritoneal reflection at the gallbladder neck was opened. Blunt dissection was then carried out in this area until the gallbladder neck-cystic duct junction was readily identified and a good window was created. A single clip was placed on the gallbladder neck. A small  ductotomy was made just below the clip with laparoscopic scissors. A 14-gauge Angiocath was then placed through the anterior abdominal wall under direct vision. A Reddick cholangiogram catheter was then placed through the Angiocath and flushed. The catheter was then placed in the cystic duct and anchored in place with a clip. A cholangiogram was obtained that showed no filling defects good emptying into the duodenum an adequate length on the cystic duct. The anchoring clip and catheters were then removed from the patient. 3  clips were placed proximally on the cystic duct and the duct was divided between the 2 sets of clips. Posterior to this the cystic artery was identified and again dissected bluntly in a circumferential manner until a good window  was created. 2 clips were placed proximally  and one distally on the artery and the artery was divided between the 2 sets of clips. Next a laparoscopic hook cautery device was used to separate the gallbladder from the liver bed. Prior to completely detaching the gallbladder from the liver bed the liver bed was inspected and several small bleeding points were coagulated with the electrocautery until the area was completely hemostatic. The gallbladder was then detached the rest of it from the liver bed without difficulty. A laparoscopic bag was inserted through the hassan port. The laparoscope was moved to the epigastric port. The gallbladder was placed within the bag and the bag was sealed.  The bag with the gallbladder was then removed with the [facility] cannula through the infraumbilical port without difficulty. The fascial defect was then closed with the previously placed Vicryl pursestring stitch as well as with another figure-of-eight 0 Vicryl stitch. The liver bed was inspected again and found to be hemostatic. The abdomen was irrigated with copious amounts of saline until the effluent was clear. The ports were then removed under direct vision without difficulty and were found to be hemostatic. The gas was allowed to escape. The skin incisions were all closed with interrupted 4-0 Monocryl subcuticular stitches. Dermabond dressings were applied. The patient tolerated the procedure well. At the end of the case all needle sponge and instrument counts were correct. The patient was then awakened and taken to recovery in stable condition  PLAN OF CARE: Discharge to home after PACU  PATIENT DISPOSITION:  PACU - hemodynamically stable.   Delay start of Pharmacological VTE agent (>24hrs) due to surgical blood loss or risk of bleeding: not applicable

## 2020-09-07 NOTE — H&P (Signed)
Lisa Benton  Location: [facility] Patient #: [id number] DOB: 01-31-1991 Married / Language: English / Race: White Female   History of Present Illness The patient is a 29 year old female who presents with abdominal pain. We are asked to see the patient in consultation by Dr. Gerald Leitz to evaluate her for gallstones. The patient is a 29 year old white female who has had known gallstones. During her second trimester of pregnancy she started having significant right upper quadrant pain. The pain is now constant. The pain has been associated with significant nausea and vomiting. She has intermittently had to go to the emergency department for fluids. A recent ultrasound showed stones in the gallbladder but no gallbladder wall thickening or ductal dilatation. Her liver functions were slightly elevated which could be from pregnancy. She is otherwise in good health. She does not smoke.   Past Surgical History Foot Surgery  Left. Oral Surgery   Diagnostic Studies History  Colonoscopy  never Mammogram  never Pap Smear  1-5 years ago  Allergies  Penicillin VK *PENICILLINS*  Allergies Reconciled   Medication History  HYDROmorphone HCl (2MG  Tablet, Oral) Active. Doxylamine-Pyridoxine (10-10MG  Tablet DR, Oral) Active. Promethazine HCl (12.5MG  Tablet, Oral) Active. Medications Reconciled  Social History  Alcohol use  Occasional alcohol use. Caffeine use  Coffee, Tea. No drug use  Tobacco use  Never smoker.  Family History Alcohol Abuse  Family Members In General. Arthritis  Family Members In General. Breast Cancer  Family Members In General. Cancer  Family Members In General. Cerebrovascular Accident  Family Members In General. Colon Cancer  Family Members In General. Depression  Sister. Diabetes Mellitus  Father. Heart Disease  Family Members In General. Hypertension  Family Members In General. Kidney Disease  Family Members In  General. Malignant Neoplasm Of Pancreas  Family Members In General. Melanoma  Family Members In General. Migraine Headache  Mother. Ovarian Cancer  Family Members In General. Prostate Cancer  Family Members In General. Seizure disorder  Family Members In General. Thyroid problems  Family Members In General.  Pregnancy / Birth History Age at menarche  15 years. Gravida  3 Irregular periods  Maternal age  79-25 Para  0  Other Problems  Cholelithiasis  Kidney Stone     Review of Systems General Not Present- Appetite Loss, Chills, Fatigue, Fever, Night Sweats, Weight Gain and Weight Loss. Skin Not Present- Change in Wart/Mole, Dryness, Hives, Jaundice, New Lesions, Non-Healing Wounds, Rash and Ulcer. HEENT Not Present- Earache, Hearing Loss, Hoarseness, Nose Bleed, Oral Ulcers, Ringing in the Ears, Seasonal Allergies, Sinus Pain, Sore Throat, Visual Disturbances, Wears glasses/contact lenses and Yellow Eyes. Respiratory Not Present- Bloody sputum, Chronic Cough, Difficulty Breathing, Snoring and Wheezing. Breast Not Present- Breast Mass, Breast Pain, Nipple Discharge and Skin Changes. Cardiovascular Not Present- Chest Pain, Difficulty Breathing Lying Down, Leg Cramps, Palpitations, Rapid Heart Rate, Shortness of Breath and Swelling of Extremities. Gastrointestinal Present- Abdominal Pain, Chronic diarrhea, Nausea and Vomiting. Not Present- Bloating, Bloody Stool, Change in Bowel Habits, Constipation, Difficulty Swallowing, Excessive gas, Gets full quickly at meals, Hemorrhoids, Indigestion and Rectal Pain. Female Genitourinary Not Present- Frequency, Nocturia, Painful Urination, Pelvic Pain and Urgency. Musculoskeletal Not Present- Back Pain, Joint Pain, Joint Stiffness, Muscle Pain, Muscle Weakness and Swelling of Extremities. Neurological Not Present- Decreased Memory, Fainting, Headaches, Numbness, Seizures, Tingling, Tremor, Trouble walking and Weakness. Psychiatric Not  Present- Anxiety, Bipolar, Change in Sleep Pattern, Depression, Fearful and Frequent crying. Endocrine Not Present- Cold Intolerance, Excessive Hunger, Hair Changes, Heat Intolerance,  Hot flashes and New Diabetes. Hematology Not Present- Blood Thinners, Easy Bruising, Excessive bleeding, Gland problems, HIV and Persistent Infections.  Vitals Weight: 180 lb Height: 65in Body Surface Area: 1.89 m Body Mass Index: 29.95 kg/m  Temp.: 98.71F  Pulse: 103 (Regular)  P.OX: 97% (Room air) BP: 112/70(Sitting, Left Arm, Standard)       Physical Exam  General Mental Status-Alert. General Appearance-Consistent with stated age. Hydration-Well hydrated. Voice-Normal.  Head and Neck Head-normocephalic, atraumatic with no lesions or palpable masses. Trachea-midline. Thyroid Gland Characteristics - normal size and consistency.  Eye Eyeball - Bilateral-Extraocular movements intact. Sclera/Conjunctiva - Bilateral-No scleral icterus.  Chest and Lung Exam Chest and lung exam reveals -quiet, even and easy respiratory effort with no use of accessory muscles and on auscultation, normal breath sounds, no adventitious sounds and normal vocal resonance. Inspection Chest Wall - Normal. Back - normal.  Cardiovascular Cardiovascular examination reveals -normal heart sounds, regular rate and rhythm with no murmurs and normal pedal pulses bilaterally.  Abdomen Note: The patient is [redacted] weeks pregnant. She has significant tenderness in the right upper quadrant. The rest of the abdomen is soft.   Neurologic Neurologic evaluation reveals -alert and oriented x 3 with no impairment of recent or remote memory. Mental Status-Normal.  Musculoskeletal Normal Exam - Left-Upper Extremity Strength Normal and Lower Extremity Strength Normal. Normal Exam - Right-Upper Extremity Strength Normal and Lower Extremity Strength Normal.  Lymphatic Head & Neck  General Head &  Neck Lymphatics: Bilateral - Description - Normal. Axillary  General Axillary Region: Bilateral - Description - Normal. Tenderness - Non Tender. Femoral & Inguinal  Generalized Femoral & Inguinal Lymphatics: Bilateral - Description - Normal. Tenderness - Non Tender.    Assessment & Plan  GALLSTONES (K80.20) Impression: The patient appears to have symptomatic gallstones. Because of the risk of further painful episodes and possible pancreatitis and think she would benefit from having her gallbladder removed. She would also like to have this done. I have discussed with her in detail the risks and benefits of the operation as well as some of the technical aspects including the risk of common duct injury and she understands and wishes to proceed. She is also [redacted] weeks pregnant and will need to deliver prior to [place] performing the surgery. She will contact her OB about the earliest possible date and let us know. We will go ahead and begin surgical planning. This patient encounter took 45 minutes today to perform the following: take history, perform exam, review outside records, interpret imaging, counsel the patient on their diagnosis and document encounter, findings & plan in the EHR

## 2020-09-08 ENCOUNTER — Encounter (HOSPITAL_COMMUNITY): Payer: Self-pay | Admitting: General Surgery

## 2020-09-08 LAB — SURGICAL PATHOLOGY

## 2020-09-14 ENCOUNTER — Encounter (HOSPITAL_COMMUNITY): Payer: Self-pay | Admitting: General Surgery

## 2020-10-15 ENCOUNTER — Other Ambulatory Visit (HOSPITAL_COMMUNITY): Payer: Self-pay | Admitting: Student

## 2020-10-15 ENCOUNTER — Other Ambulatory Visit: Payer: Self-pay | Admitting: Student

## 2020-10-15 DIAGNOSIS — R1033 Periumbilical pain: Secondary | ICD-10-CM

## 2020-10-16 ENCOUNTER — Ambulatory Visit
Admission: RE | Admit: 2020-10-16 | Discharge: 2020-10-16 | Disposition: A | Discharge status: Home / Self Care | Payer: Managed Care, Other (non HMO) | Source: Ambulatory Visit | Attending: Student | Admitting: Student

## 2020-10-16 ENCOUNTER — Other Ambulatory Visit: Payer: Self-pay

## 2020-10-16 DIAGNOSIS — R1033 Periumbilical pain: Secondary | ICD-10-CM

## 2020-10-16 MED ORDER — IOPAMIDOL (ISOVUE-300) INJECTION 61%
100.0000 mL | Freq: Once | INTRAVENOUS | Status: AC | PRN
  Administered 2020-10-16: 100 mL via INTRAVENOUS

## 2020-10-19 ENCOUNTER — Encounter (HOSPITAL_COMMUNITY): Payer: Self-pay

## 2020-10-19 ENCOUNTER — Ambulatory Visit (HOSPITAL_COMMUNITY): Payer: Managed Care, Other (non HMO)

## 2021-09-07 ENCOUNTER — Inpatient Hospital Stay (HOSPITAL_COMMUNITY)
Admission: AD | Admit: 2021-09-07 | Discharge: 2021-09-07 | Disposition: A | Discharge status: Home / Self Care | Payer: Managed Care, Other (non HMO) | Attending: Obstetrics and Gynecology | Admitting: Obstetrics and Gynecology

## 2021-09-07 ENCOUNTER — Encounter (HOSPITAL_COMMUNITY): Payer: Self-pay | Admitting: Obstetrics and Gynecology

## 2021-09-07 ENCOUNTER — Other Ambulatory Visit: Payer: Self-pay

## 2021-09-07 DIAGNOSIS — O479 False labor, unspecified: Secondary | ICD-10-CM | POA: Diagnosis not present

## 2021-09-07 DIAGNOSIS — O368120 Decreased fetal movements, second trimester, not applicable or unspecified: Secondary | ICD-10-CM | POA: Insufficient documentation

## 2021-09-07 DIAGNOSIS — Z3A20 20 weeks gestation of pregnancy: Secondary | ICD-10-CM | POA: Diagnosis not present

## 2021-09-07 DIAGNOSIS — O4702 False labor before 37 completed weeks of gestation, second trimester: Secondary | ICD-10-CM | POA: Diagnosis not present

## 2021-09-07 LAB — URINALYSIS, ROUTINE W REFLEX MICROSCOPIC
Bilirubin Urine: NEGATIVE
Glucose, UA: >=500 mg/dL — AB
Hgb urine dipstick: NEGATIVE
Ketones, ur: 15 mg/dL — AB
Leukocytes,Ua: NEGATIVE
Nitrite: NEGATIVE
Protein, ur: NEGATIVE mg/dL
Specific Gravity, Urine: 1.015 (ref 1.005–1.030)
pH: 7 (ref 5.0–8.0)

## 2021-09-07 LAB — WET PREP, GENITAL
Clue Cells Wet Prep HPF POC: NONE SEEN
Sperm: NONE SEEN
Trich, Wet Prep: NONE SEEN
WBC, Wet Prep HPF POC: <10 (ref <10)
Yeast Wet Prep HPF POC: NONE SEEN

## 2021-09-07 LAB — URINALYSIS, MICROSCOPIC (REFLEX): Bacteria, UA: NONE SEEN

## 2021-09-07 NOTE — MAU Provider Note (Signed)
History     CSN: [id number]  Arrival date and time: 09/07/21 1639   Event Date/Time   First Provider Initiated Contact with Patient 09/07/21 1710      Chief Complaint  Patient presents with   Decreased Fetal Movement   Contractions   29 y.o. [id number] @20 .4 wks presenting with decreased FM and ctx. Reports no FM since this am. Endorses more frequent BH ctx today. Endorse heavier thick white vaginal discharge. Denies itching or malodor. Denies urinary sx. No recent IC. She is eating and drinking well.  OB History     Gravida  4   Para  1   Term  1   Preterm      AB  2   Living  1      SAB  2   IAB      Ectopic      Multiple  0   Live Births  1           Past Medical History:  Diagnosis Date   Chronic kidney disease    Kidney stones   Gall stones    Headache    Migraines   History of kidney stones    PONV (postoperative nausea and vomiting)    Thoracic outlet syndrome     Past Surgical History:  Procedure Laterality Date   CHOLECYSTECTOMY N/A 09/07/2020   Procedure: LAPAROSCOPIC CHOLECYSTECTOMY WITH INTRAOPERATIVE CHOLANGIOGRAM;  Surgeon: 09/09/2020, MD;  Location: [facility];  Service: General;  Laterality: N/A;   FOOT SURGERY Left    KIDNEY STONE SURGERY     WISDOM TOOTH EXTRACTION      Family History  Problem Relation Age of Onset   Diabetes Father    Cancer Maternal Grandfather     Social History   Tobacco Use   Smoking status: Never   Smokeless tobacco: Never  Vaping Use   Vaping Use: Never used  Substance Use Topics   Alcohol use: Not Currently   Drug use: Never    Allergies:  Allergies  Allergen Reactions   Other Anaphylaxis and Other (See Comments)    preservatives Stinging or biting creatures (insects, jellyfish)    Penicillins Anaphylaxis   Shellfish Allergy Anaphylaxis and Rash   Cephalexin Rash and Swelling   Eggs Or Egg-Derived Products Nausea And Vomiting and Other (See Comments)    Flu like symptoms- egg  beaters okay    Sulfamethoxazole-Trimethoprim Other (See Comments)    Secondary Yeast Infection     Medications Prior to Admission  Medication Sig Dispense Refill Last Dose   Prenatal Vit-Fe Fumarate-FA (PRENATAL MULTIVITAMIN) TABS tablet Take 1 tablet by mouth daily at 12 noon.   09/06/2021   Doxylamine-Pyridoxine (DICLEGIS PO) Take 1 tablet by mouth daily.       famotidine (PEPCID) 10 MG tablet Take 10 mg by mouth daily.       HYDROcodone-acetaminophen (NORCO/VICODIN) 5-325 MG tablet Take 1-2 tablets by mouth every 6 (six) hours as needed for moderate pain or severe pain. 10 tablet 0    ibuprofen (ADVIL) 800 MG tablet Take 1 tablet (800 mg total) by mouth every 8 (eight) hours as needed. 30 tablet 0    nitrofurantoin (MACRODANTIN) 100 MG capsule Take 100 mg by mouth at bedtime. For 7 days      promethazine (PHENERGAN) 25 MG tablet Take 1 tablet (25 mg total) by mouth every 8 (eight) hours as needed for nausea or vomiting. 30 tablet 0     Review of  Systems  Gastrointestinal:  Negative for abdominal pain (ctx).  Genitourinary:  Positive for vaginal discharge. Negative for dysuria, frequency, urgency and vaginal bleeding.  Physical Exam   Blood pressure 113/71, pulse 88, temperature 98.5 F (36.9 C), temperature source Oral, resp. rate 17, height 5\' 5"  (1.651 m), weight 82.1 kg, SpO2 99 %, unknown if currently breastfeeding.  Physical Exam Vitals and nursing note reviewed. Exam conducted with a chaperone present.  Constitutional:      General: She is not in acute distress.    Appearance: Normal appearance.  HENT:     Head: Normocephalic and atraumatic.  Cardiovascular:     Rate and Rhythm: Normal rate.  Pulmonary:     Effort: Pulmonary effort is normal. No respiratory distress.  Abdominal:     General: There is no distension.     Palpations: Abdomen is soft. There is no mass.     Tenderness: There is no abdominal tenderness. There is no guarding or rebound.     Hernia: No  hernia is present.     Comments: FH@U   Musculoskeletal:        General: Normal range of motion.     Cervical back: Normal range of motion.  Skin:    General: Skin is warm and dry.  Neurological:     General: No focal deficit present.     Mental Status: She is alert and oriented to person, place, and time.  Psychiatric:        Mood and Affect: Mood normal.        Behavior: Behavior normal.  FHT 145 Limited bedside : viable, active fetus, +cardiac activity, subj. nml AFV  Results for orders placed or performed during the hospital encounter of 09/07/21 (from the past 24 hour(s))  Wet prep, genital     Status: None   Collection Time: 09/07/21  5:04 PM   Specimen: Vaginal  Result Value Ref Range   Yeast Wet Prep HPF POC NONE SEEN NONE SEEN   Trich, Wet Prep NONE SEEN NONE SEEN   Clue Cells Wet Prep HPF POC NONE SEEN NONE SEEN   WBC, Wet Prep HPF POC <10 <10   Sperm NONE SEEN   Urinalysis, Routine w reflex microscopic Urine, Clean Catch     Status: Abnormal   Collection Time: 09/07/21  5:04 PM  Result Value Ref Range   Color, Urine YELLOW YELLOW   APPearance CLEAR CLEAR   Specific Gravity, Urine 1.015 1.005 - 1.030   pH 7.0 5.0 - 8.0   Glucose, UA >=500 (A) NEGATIVE mg/dL   Hgb urine dipstick NEGATIVE NEGATIVE   Bilirubin Urine NEGATIVE NEGATIVE   Ketones, ur 15 (A) NEGATIVE mg/dL   Protein, ur NEGATIVE NEGATIVE mg/dL   Nitrite NEGATIVE NEGATIVE   Leukocytes,Ua NEGATIVE NEGATIVE  Urinalysis, Microscopic (reflex)     Status: None   Collection Time: 09/07/21  5:04 PM  Result Value Ref Range   RBC / HPF 0-5 0 - 5 RBC/hpf   WBC, UA 0-5 0 - 5 WBC/hpf   Bacteria, UA NONE SEEN NONE SEEN   Squamous Epithelial / LPF 0-5 0 - 5   Amorphous Crystal PRESENT    MAU Course  Procedures  MDM No prenatal records on file. Pregnancy uncomplicated per pt. Labs ordered and reviewed. No signs of PTL, UTI, or infection. Pt reassured. Discussed FM can be variable at this GA. Stable for  discharge home.   Assessment and Plan   1. [redacted] weeks gestation of pregnancy  2. Braxton Hick's contraction    Discharge home Follow up at [facility] as scheduled PTL precautions  Allergies as of 09/07/2021       Reactions   Other Anaphylaxis, Other (See Comments)   preservatives Stinging or biting creatures (insects, jellyfish)   Penicillins Anaphylaxis   Shellfish Allergy Anaphylaxis, Rash   Cephalexin Rash, Swelling   Eggs Or Egg-derived Products Nausea And Vomiting, Other (See Comments)   Flu like symptoms- egg beaters okay   Sulfamethoxazole-trimethoprim Other (See Comments)   Secondary Yeast Infection        Medication List     STOP taking these medications    DICLEGIS PO   famotidine 10 MG tablet Commonly known as: PEPCID   HYDROcodone-acetaminophen 5-325 MG tablet Commonly known as: NORCO/VICODIN   ibuprofen 800 MG tablet Commonly known as: ADVIL   nitrofurantoin 100 MG capsule Commonly known as: MACRODANTIN       TAKE these medications    prenatal multivitamin Tabs tablet Take 1 tablet by mouth daily at 12 noon.   promethazine 25 MG tablet Commonly known as: PHENERGAN Take 1 tablet (25 mg total) by mouth every 8 (eight) hours as needed for nausea or vomiting.        Donette Larry, CNM 09/07/2021, 6:09 PM

## 2021-09-07 NOTE — MAU Note (Signed)
.  Lisa Benton is a 30 y.o. at [redacted]w[redacted]d here in MAU reporting: has not felt the baby move all day. States she has been feeling the baby move since 14 weeks. Denies VB or LOF. Has an increase in "sticky white" discharge. Also she has felt ctx throughout the day. At one point they were 1-5 minutes apart but now they are irregular.   Pain score: 4 Vitals:   09/07/21 1651  BP: 127/88  Pulse: 89  Resp: 15  Temp: 98.5 F (36.9 C)  SpO2: 98%     FHT:145 Lab orders placed from triage:  UA

## 2021-09-08 LAB — GC/CHLAMYDIA PROBE AMP (~~LOC~~) NOT AT ARMC
Chlamydia: NEGATIVE
Comment: NEGATIVE
Comment: NORMAL
Neisseria Gonorrhea: NEGATIVE

## 2021-10-10 NOTE — L&D Delivery Note (Signed)
Vaginal Delivery Note ? ?Patient pushed for approximately 30 minutes after she was noted to be C/C/+2. Guided pushing with maternal urge and regular contractions.  Manual turning of fetal head was performed from OP to OA.  ? ?At 13:19 a delivery of a viable and healthy female was delivered over intact perineum via  (Presentation: OA ) APGAR: 9, 9; weight 8 lb 11 oz (3940 g).   ?After head was delivered, shoulders and body easily delivered.  Baby laid on maternal abdomen, dried and tactile stimulation.  ?Baby noted to have a vigorous cry and moving all four extremities. Delayed cord clamping done and cord cut by father. Cord blood obtained.  ? ?Placenta spontaneously delivered intact with trailing membranes,  Uterine atony alleviated by IV pitocin and massage.  ?There were no perineal or cervical lacerations noted upon inspection. First degree vaginal laceration was repaired in routine fashion with 3-0 vicryl.  ? ?Patient tolerated delivery well, there were no complications.  ? ? ?Delivery Details: ?Delivery Type: NSVD  ?Anesthesia Epidural  ?Episiotomy:  N/A  ?Lacerations:  1st degree vaginal  ?Repair suture:  3-0 vicryl  ?Blood loss (ml):  200  ? ?Birth information: ?Date of birth:   [birth year]  ?Time of birth:   13:19  ?Sex: female    ?Name:  "Gweneth Dimitri"  ?APGAR APGAR (1 MIN): 9   ?APGAR (5 MINS): 9   ?APGAR (10 MINS):   ?  ?Weight  8 lb 11 oz (3940 g)  ? ?Resuscitation:   Drying and stimulation  ?Cord information:    Blood gases sent?  N ?Complications:    None  ?Placenta: Delivered: Spontaneous, intact      appearance: normal 3VC ? ? ?Disposition: ?Mom to postpartum.  Baby to Couplet care / Skin to Skin. ? ?Essie Hart MD ?01/30/2022, 2:57 PM ? ?   ? ?  ?

## 2021-11-04 IMAGING — US US ABDOMEN LIMITED
1 series · 15 of 25 positions shown · non-contrast
Comparison: None.

CLINICAL DATA: Right upper quadrant pain. Nausea and vomiting.
Pregnant patient at 24 weeks gestation.

EXAM:
ULTRASOUND ABDOMEN LIMITED RIGHT UPPER QUADRANT

[Series 1: us abdomen limited · 43 acquisitions, 15 frames shown]
[im 1/43]
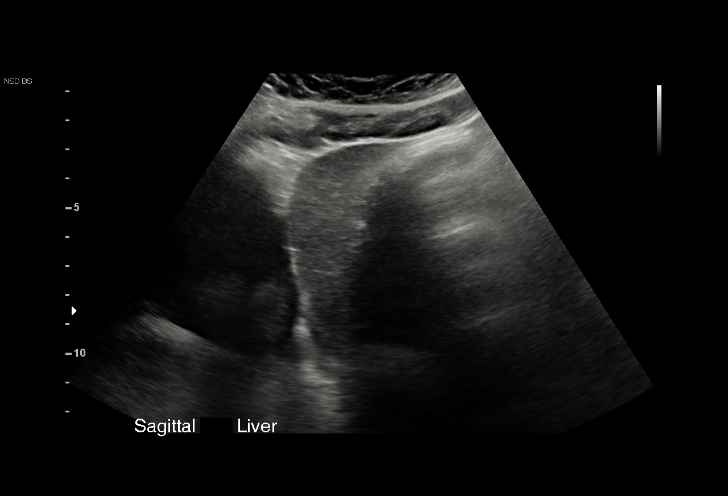
[im 4/43]
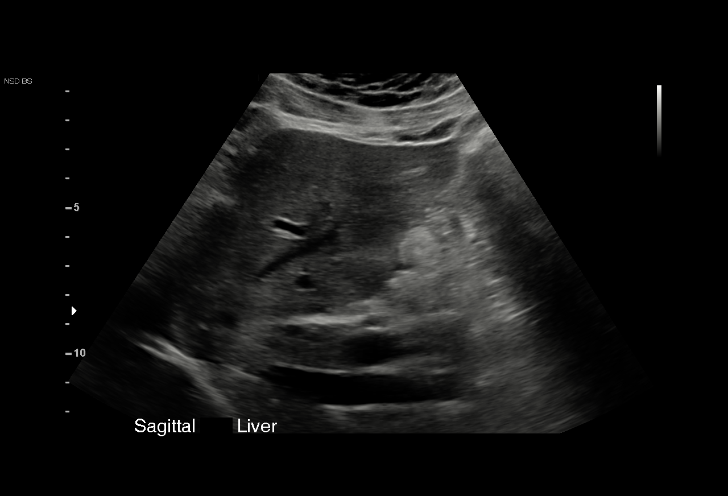
[im 8/43]
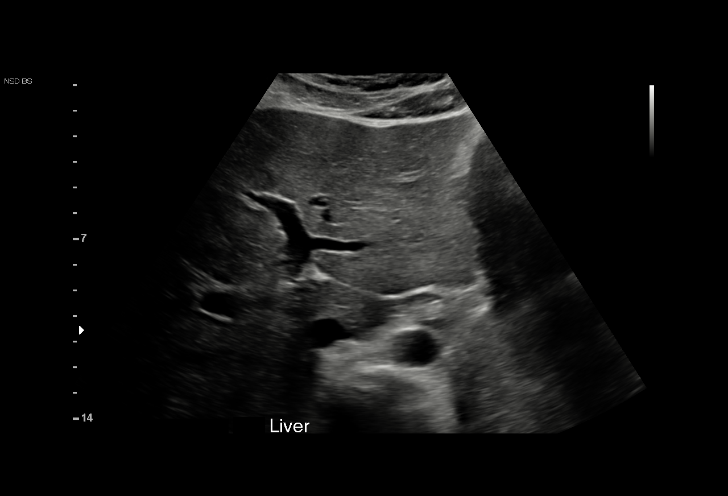
[im 9/43]
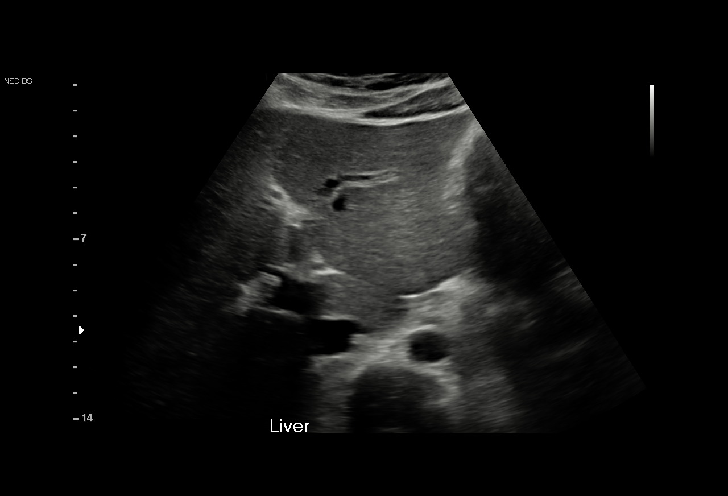
[im 13/43]
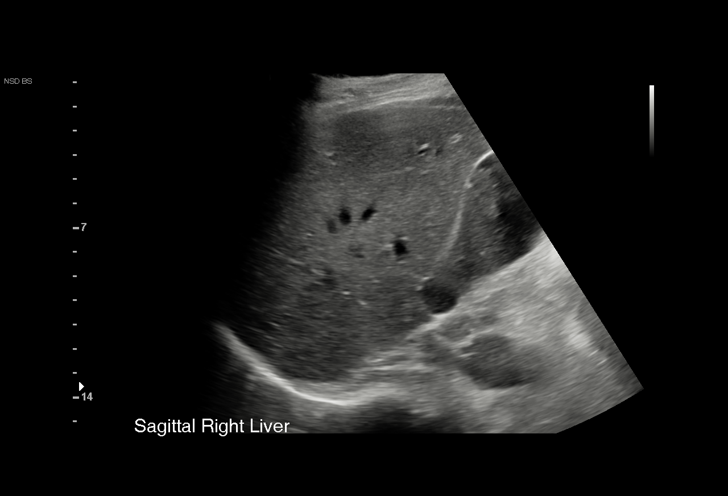
[im 16/43]
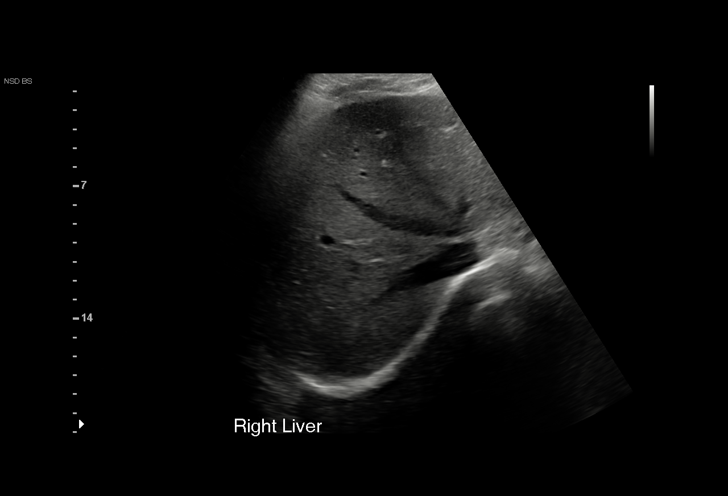
[im 18/43]
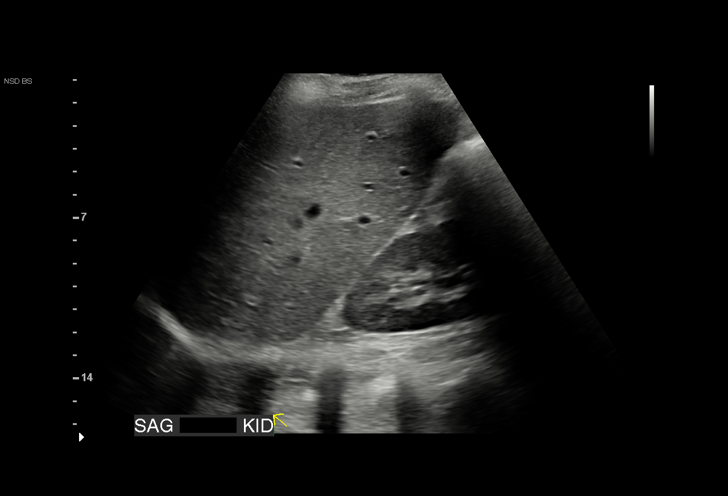
[im 22/43]
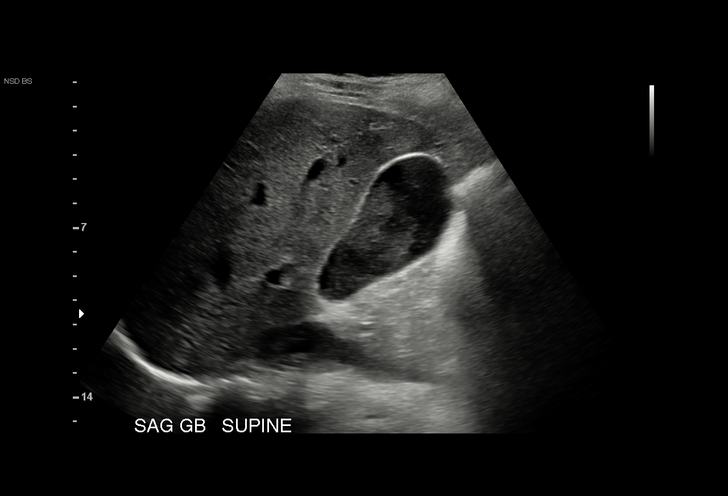
[im 25/43]
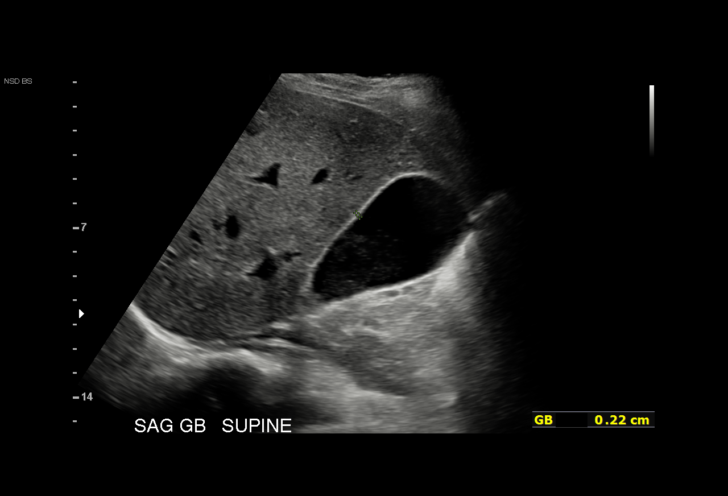
[im 27/43]
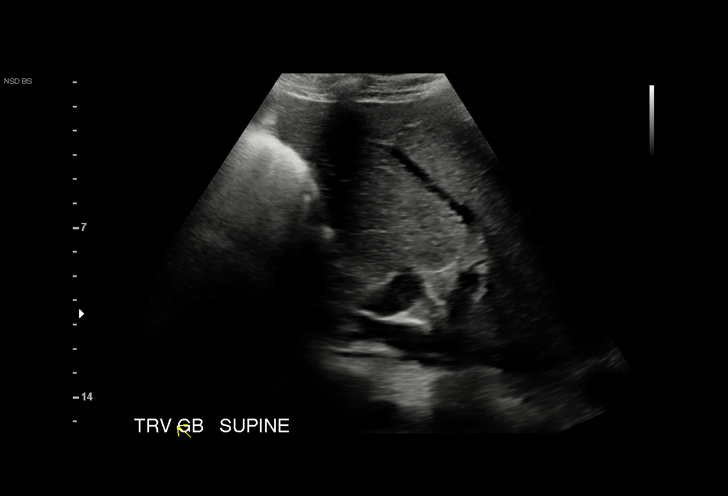
[im 30/43]
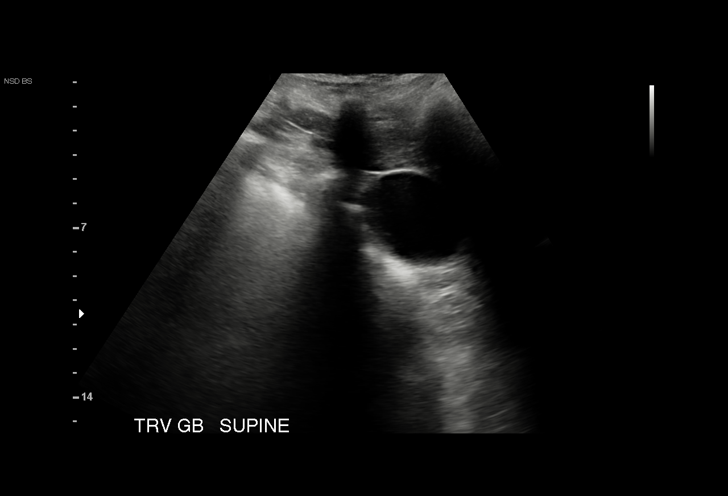
[im 34/43]
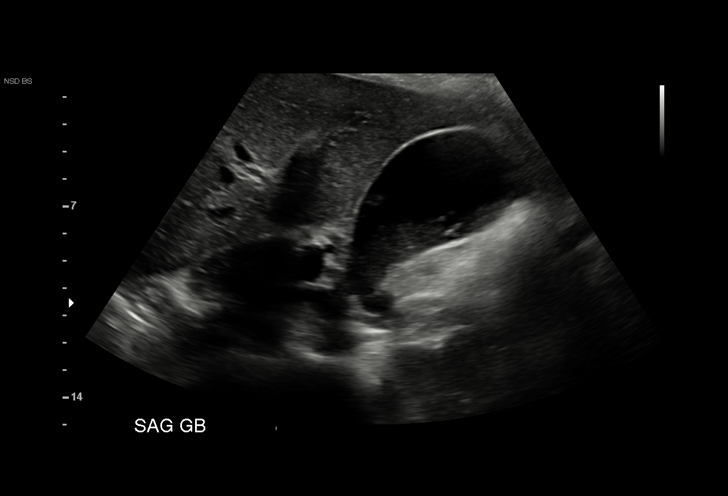
[im 36/43]
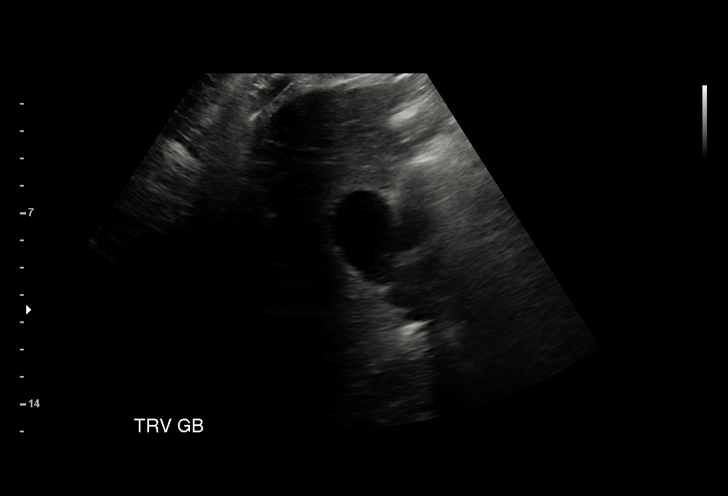
[im 39/43]
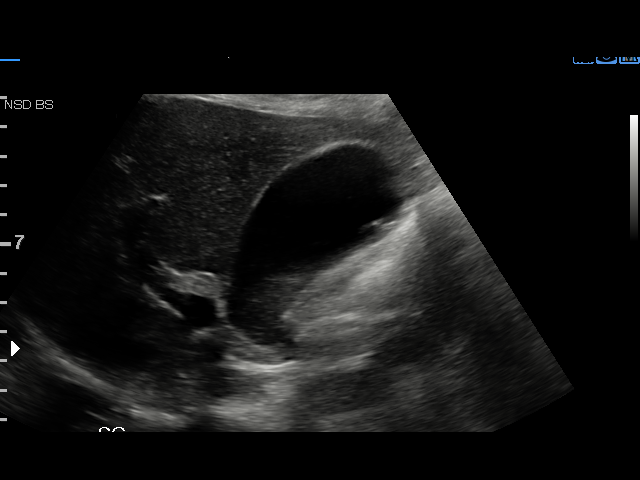
[im 43/43]
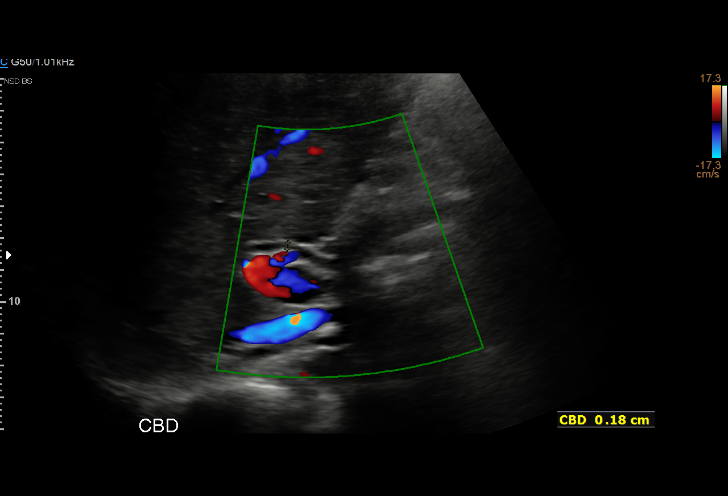

[15 of 25 positions shown; findings below may reference images not displayed]

FINDINGS: Gallbladder:

Physiologically distended containing intraluminal sludge and small
stones. No gallbladder wall thickening or pericholecystic fluid. No
sonographic Murphy sign noted by sonographer.

Common bile duct:

Diameter: 2 mm, normal.

Liver:

No focal lesion identified. Within normal limits in parenchymal
echogenicity. Portal vein is patent on color Doppler imaging with
normal direction of blood flow towards the liver.

Other: None.
IMPRESSION: 1. Sludge and small stones in the gallbladder without sonographic
findings of acute cholecystitis.
2. No biliary dilatation.

## 2021-12-24 ENCOUNTER — Encounter (HOSPITAL_COMMUNITY): Payer: Self-pay | Admitting: Obstetrics and Gynecology

## 2021-12-24 ENCOUNTER — Inpatient Hospital Stay (HOSPITAL_COMMUNITY)
Admission: AD | Admit: 2021-12-24 | Discharge: 2021-12-24 | Disposition: A | Discharge status: Home / Self Care | Payer: Managed Care, Other (non HMO) | Attending: Obstetrics and Gynecology | Admitting: Obstetrics and Gynecology

## 2021-12-24 ENCOUNTER — Other Ambulatory Visit: Payer: Self-pay

## 2021-12-24 DIAGNOSIS — O4703 False labor before 37 completed weeks of gestation, third trimester: Secondary | ICD-10-CM | POA: Insufficient documentation

## 2021-12-24 DIAGNOSIS — Z3A36 36 weeks gestation of pregnancy: Secondary | ICD-10-CM | POA: Diagnosis not present

## 2021-12-24 DIAGNOSIS — O479 False labor, unspecified: Secondary | ICD-10-CM

## 2021-12-24 DIAGNOSIS — Z3689 Encounter for other specified antenatal screening: Secondary | ICD-10-CM | POA: Diagnosis not present

## 2021-12-24 LAB — URINALYSIS, ROUTINE W REFLEX MICROSCOPIC
Bilirubin Urine: NEGATIVE
Glucose, UA: >=500 mg/dL — AB
Hgb urine dipstick: NEGATIVE
Ketones, ur: 5 mg/dL — AB
Leukocytes,Ua: NEGATIVE
Nitrite: NEGATIVE
Protein, ur: 30 mg/dL — AB
Specific Gravity, Urine: 1.022 (ref 1.005–1.030)
pH: 6 (ref 5.0–8.0)

## 2021-12-24 NOTE — MAU Provider Note (Signed)
S: Ms. Skye Nunamaker is a 31 y.o. [id number] at [redacted]w[redacted]d who presents to MAU today for labor evaluation.    ? ?Cervical exam by RN:  ?Dilation: 1 ?Effacement (%): Thick ?Station: -3 ?Presentation: Vertex ?Exam by:: Truitt Leep, RNC ? ?Fetal Monitoring: ?Baseline: 130 bpm ?Variability: moderate ?Accelerations: 15x15 present ?Decelerations: absent ?Contractions: Q 5-65mins ? ?MDM ?Discussed patient with RN. NST reviewed.  ? ?A: ?SIUP at [redacted]w[redacted]d  ?False labor ? ? ?P: ?Discharge home ?Labor precautions and kick counts included in AVS ?Patient to follow-up with [facility] as scheduled  ?Patient may return to MAU as needed or when in labor  ? ? ?Renee Harder, CNM ?12/24/2021 5:20 PM ? ?

## 2021-12-24 NOTE — MAU Note (Signed)
Lisa Benton is a 31 y.o. at [redacted]w[redacted]d here in MAU reporting: ctxs every 8-10 minutes since 1000 this morning ? ?Onset of complaint: today @ 1000 ?Pain score: 3 ?Vitals:  ? 12/24/21 1544  ?BP: 114/70  ?Pulse: (!) 103  ?Resp: 19  ?Temp: 98.5 ?F (36.9 ?C)  ?SpO2: 98%  ?   ?FHT:121 bpm w/ +FM.  Denies LOF or VB ?Lab orders placed from triage: U/A  ?

## 2022-01-06 ENCOUNTER — Inpatient Hospital Stay (HOSPITAL_COMMUNITY)
Admission: AD | Admit: 2022-01-06 | Discharge: 2022-01-06 | Disposition: A | Discharge status: Home / Self Care | Payer: Managed Care, Other (non HMO) | Attending: Obstetrics and Gynecology | Admitting: Obstetrics and Gynecology

## 2022-01-06 ENCOUNTER — Encounter (HOSPITAL_COMMUNITY): Payer: Self-pay | Admitting: Obstetrics and Gynecology

## 2022-01-06 DIAGNOSIS — O471 False labor at or after 37 completed weeks of gestation: Secondary | ICD-10-CM | POA: Insufficient documentation

## 2022-01-06 DIAGNOSIS — Z3A37 37 weeks gestation of pregnancy: Secondary | ICD-10-CM | POA: Insufficient documentation

## 2022-01-06 DIAGNOSIS — Z0371 Encounter for suspected problem with amniotic cavity and membrane ruled out: Secondary | ICD-10-CM | POA: Diagnosis not present

## 2022-01-06 DIAGNOSIS — O368130 Decreased fetal movements, third trimester, not applicable or unspecified: Secondary | ICD-10-CM | POA: Insufficient documentation

## 2022-01-06 DIAGNOSIS — O368190 Decreased fetal movements, unspecified trimester, not applicable or unspecified: Secondary | ICD-10-CM

## 2022-01-06 DIAGNOSIS — O479 False labor, unspecified: Secondary | ICD-10-CM

## 2022-01-06 LAB — AMNISURE RUPTURE OF MEMBRANE (ROM) NOT AT ARMC: Amnisure ROM: NEGATIVE

## 2022-01-06 NOTE — MAU Note (Signed)
.  Lisa Benton is a 31 y.o. at [redacted]w[redacted]d here in MAU reporting: she has not felt baby move for the past several hours try to do kick counts and did not feel anyhting except her stretching. Reports she had a yellow liquid discharge come out this morning and still feels slow leaking now. C/o mild irregular ctx as well. ?LMP:  ?Onset of complaint: this morning 8:30 ?Pain score: 2 ?Vitals:  ? 01/06/22 1226  ?BP: 109/66  ?Pulse: 90  ?Resp: 18  ?Temp: 98.8 ?F (37.1 ?C)  ?   ?FHT:135 ?Lab orders placed from triage:   ? ?

## 2022-01-06 NOTE — MAU Provider Note (Addendum)
?History  ?  ? ?CSN: [id number] ? ?Arrival date and time: 01/06/22 1209 ? ? Event Date/Time  ? First Provider Initiated Contact with Patient 01/06/22 1245   ?  ? ?Chief Complaint  ?Patient presents with  ? Decreased Fetal Movement  ? Rupture of Membranes  ? ?[id number] at [redacted]w[redacted]d presenting with concern for ROM at 0830, patient had white discharge when stepping out of the shower. Reports decreased movement this AM and tried drinking fluids. She did report that she did dedicated kick counts which were not the desired value. She reports having irregular contractions for several weeks without anything that has been more regular or intense. She reports since coming to MAU fetal movement is now normal. She was checked in the office and was about 1 cm.  ? ? ?OB History   ? ? Gravida  ?4  ? Para  ?1  ? Term  ?1  ? Preterm  ?   ? AB  ?2  ? Living  ?1  ?  ? ? SAB  ?2  ? IAB  ?   ? Ectopic  ?   ? Multiple  ?0  ? Live Births  ?1  ?   ?  ?  ? ? ?Past Medical History:  ?Diagnosis Date  ? Chronic kidney disease   ? Kidney stones  ? Gall stones   ? Headache   ? Migraines  ? History of kidney stones   ? PONV (postoperative nausea and vomiting)   ? Thoracic outlet syndrome   ? ? ?Past Surgical History:  ?Procedure Laterality Date  ? CHOLECYSTECTOMY N/A 09/07/2020  ? Procedure: LAPAROSCOPIC CHOLECYSTECTOMY WITH INTRAOPERATIVE CHOLANGIOGRAM;  Surgeon: Griselda Miner, MD;  Location: [facility] OR;  Service: General;  Laterality: N/A;  ? FOOT SURGERY Left   ? KIDNEY STONE SURGERY    ? WISDOM TOOTH EXTRACTION    ? ? ?Family History  ?Problem Relation Age of Onset  ? Anxiety disorder Mother   ? Depression Mother   ? Mental illness Mother   ? Diabetes Father   ? Cancer Maternal Grandfather   ? ? ?Social History  ? ?Tobacco Use  ? Smoking status: Never  ? Smokeless tobacco: Never  ?Vaping Use  ? Vaping Use: Never used  ?Substance Use Topics  ? Alcohol use: Not Currently  ? Drug use: Never  ? ? ?Allergies:  ?Allergies  ?Allergen Reactions  ? Other  Anaphylaxis and Other (See Comments)  ?  preservatives ?Stinging or biting creatures (insects, jellyfish) ?  ? Penicillins Anaphylaxis  ? Shellfish Allergy Anaphylaxis and Rash  ? Cephalexin Rash and Swelling  ? Eggs Or Egg-Derived Products Nausea And Vomiting and Other (See Comments)  ?  Flu like symptoms- egg beaters okay ?  ? Sulfamethoxazole-Trimethoprim Other (See Comments)  ?  Secondary Yeast Infection ?  ? ? ?Medications Prior to Admission  ?Medication Sig Dispense Refill Last Dose  ? ferrous sulfate 325 (65 FE) MG EC tablet Take 325 mg by mouth daily.   01/06/2022  ? Prenatal Vit-Fe Fumarate-FA (PRENATAL MULTIVITAMIN) TABS tablet Take 1 tablet by mouth daily at 12 noon.   01/06/2022  ? promethazine (PHENERGAN) 25 MG tablet Take 1 tablet (25 mg total) by mouth every 8 (eight) hours as needed for nausea or vomiting. 30 tablet 0   ? ? ?Review of Systems  ?Constitutional:  Negative for chills and fever.  ?HENT:  Negative for congestion and sore throat.   ?Eyes:  Negative for pain and visual  disturbance.  ?Respiratory:  Negative for cough, chest tightness and shortness of breath.   ?Cardiovascular:  Negative for chest pain.  ?Gastrointestinal:  Negative for abdominal pain, diarrhea, nausea and vomiting.  ?Endocrine: Negative for cold intolerance and heat intolerance.  ?Genitourinary:  Negative for dysuria and flank pain.  ?Musculoskeletal:  Negative for back pain.  ?Skin:  Negative for rash.  ?Allergic/Immunologic: Negative for food allergies.  ?Neurological:  Negative for dizziness and light-headedness.  ?Psychiatric/Behavioral:  Negative for agitation.   ?Physical Exam  ? ?Blood pressure 109/66, pulse 90, temperature 98.8 ?F (37.1 ?C), resp. rate 18, unknown if currently breastfeeding. ? ?Physical Exam ?Vitals and nursing note reviewed. Exam conducted with a chaperone present.  ?Constitutional:   ?   General: She is not in acute distress. ?   Appearance: She is well-developed.  ?   Comments: Pregnant female   ?HENT:  ?   Head: Normocephalic and atraumatic.  ?Eyes:  ?   General: No scleral icterus. ?   Conjunctiva/sclera: Conjunctivae normal.  ?Cardiovascular:  ?   Rate and Rhythm: Normal rate.  ?Pulmonary:  ?   Effort: Pulmonary effort is normal.  ?Chest:  ?   Chest wall: No tenderness.  ?Abdominal:  ?   Palpations: Abdomen is soft.  ?   Tenderness: There is no abdominal tenderness. There is no guarding or rebound.  ?   Comments: Gravid  ?Genitourinary: ?   Exam position: Lithotomy position.  ?   Labia:     ?   Right: No rash.     ?   Left: No rash.   ?   Vagina: Normal. No vaginal discharge (some discharge, non watery and thick).  ?   Cervix: Normal.  ?   Uterus: Normal. Enlarged (37 wk gravid).   ?   Comments: Negative pooling ? ?Musculoskeletal:     ?   General: Normal range of motion.  ?   Cervical back: Normal range of motion and neck supple.  ?Skin: ?   General: Skin is warm and dry.  ?   Findings: No rash.  ?Neurological:  ?   Mental Status: She is alert and oriented to person, place, and time.  ? ?Dilation: 1.5 ?Effacement (%): 50 ?Cervical Position: Anterior ?Station: Ballotable ?Presentation: Vertex ?Exam by:: Lyndel Safe MD ? ? ?MAU Course  ?Procedures ? ?MDM ?Fern slide negative x 2  ?Amnisure collected and negative.  ? ?NST 126/mod/+accels, no decels.  ?Toco: Quiet ? ? ?Results for orders placed or performed during the hospital encounter of 01/06/22 (from the past 24 hour(s))  ?Amnisure rupture of membrane (rom)not at [facility]     Status: None  ? Collection Time: 01/06/22  1:10 PM  ?Result Value Ref Range  ? Amnisure ROM NEGATIVE   ? ? ? ?Assessment and Plan  ? ?#DFM: Reactive NST, now with normal movement ? ?#Rule out ROM: negative pooling, fern and amnisure. Counseled when to return to care ? ?#Contractions: Not currently in labor. Given precautions ? ?Discharge home ? ?Federico Flake ?01/06/2022, 2:10 PM  ?

## 2022-01-12 IMAGING — US US ABDOMEN LIMITED
1 series · 15 of 25 positions shown · non-contrast
Comparison: 05/10/2020

CLINICAL DATA: Right upper quadrant pain with known gallstones and
sludge

EXAM:
ULTRASOUND ABDOMEN LIMITED RIGHT UPPER QUADRANT

[Series 1: us abdomen limited · 15 of 53 slices shown]
[im 1/53]
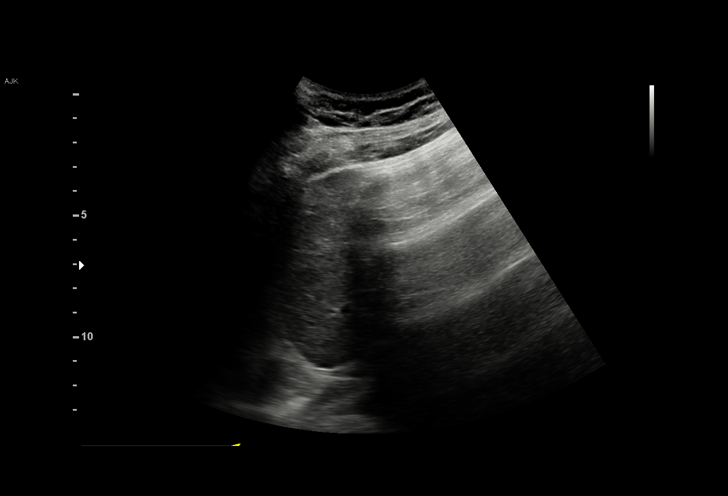
[im 5/53]
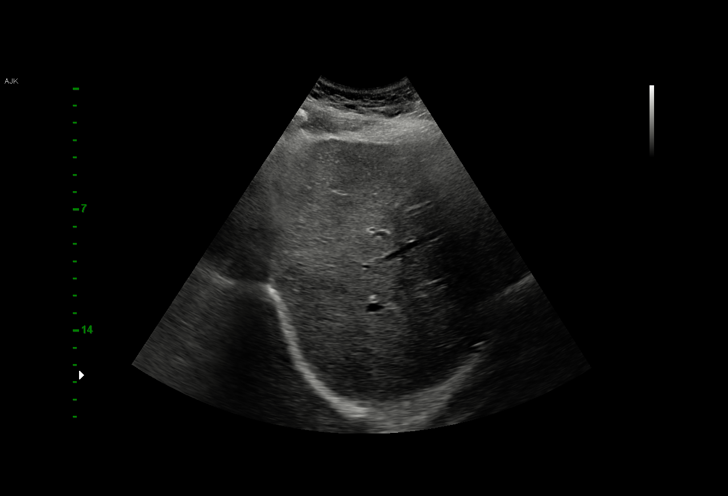
[im 9/53]
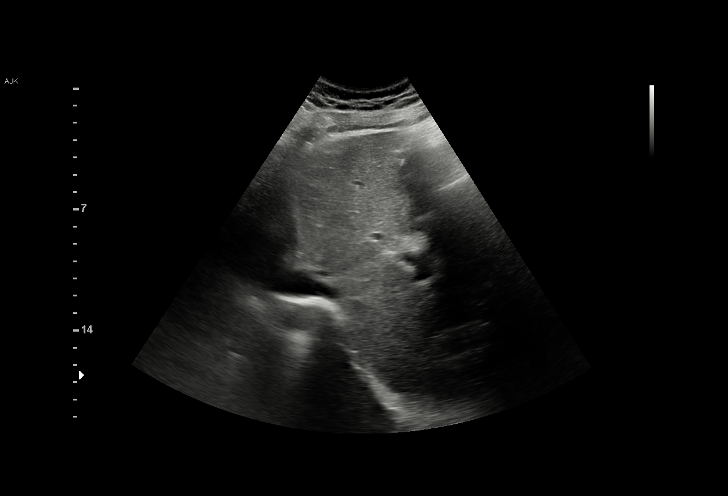
[im 11/53]
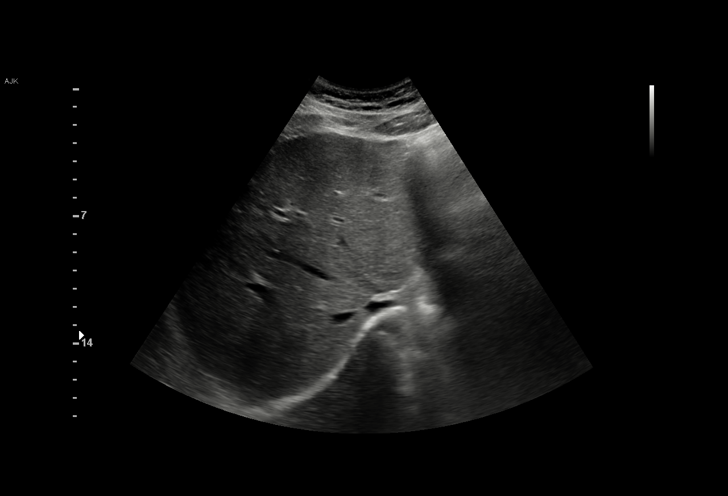
[im 16/53]
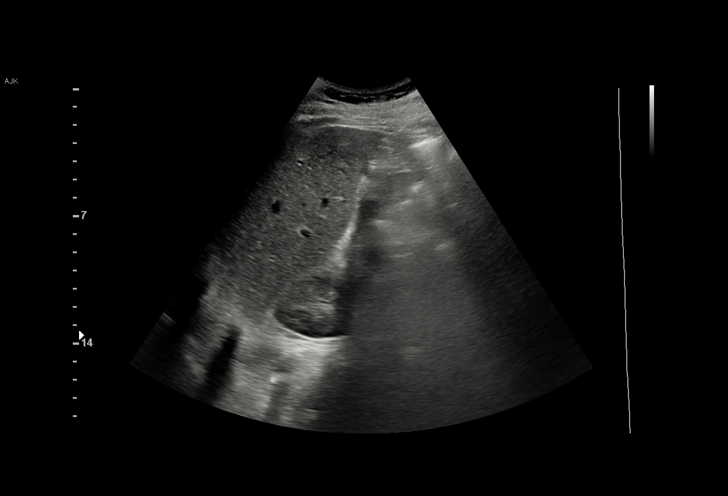
[im 20/53]
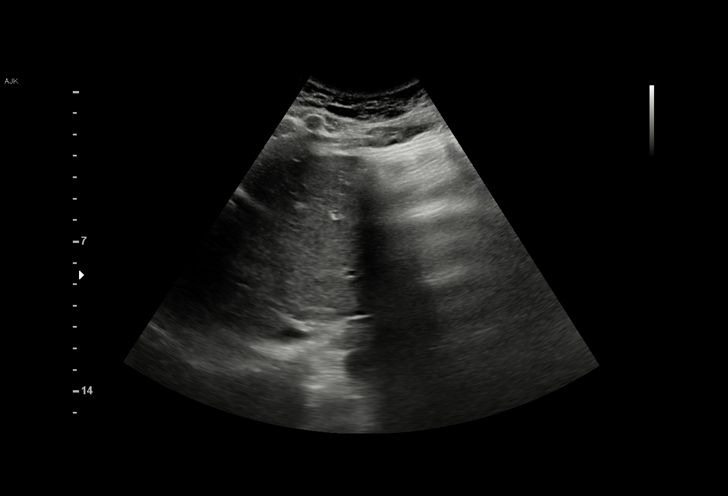
[im 22/53]
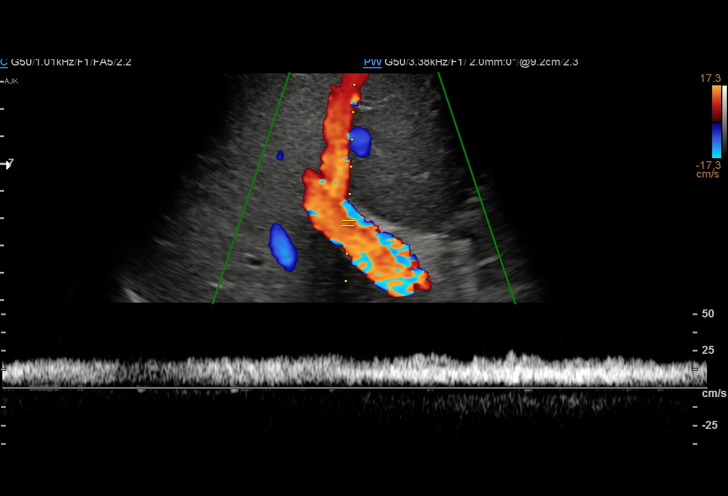
[im 27/53]
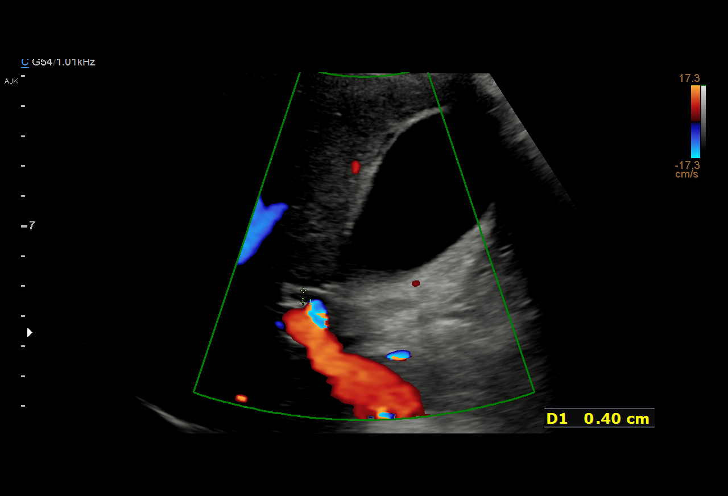
[im 31/53]
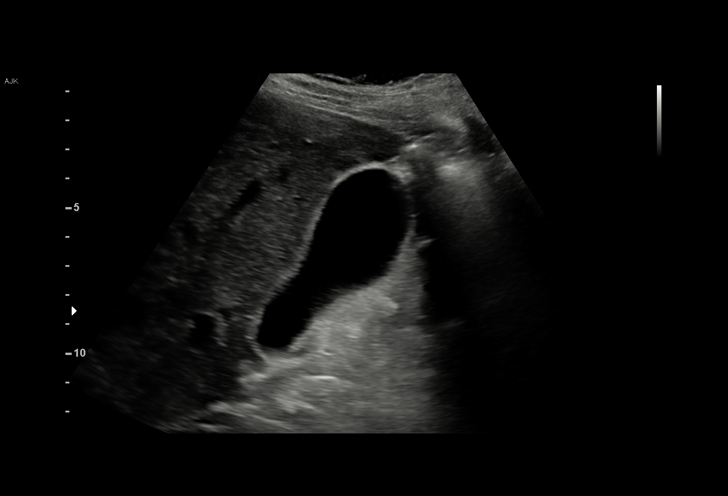
[im 33/53]
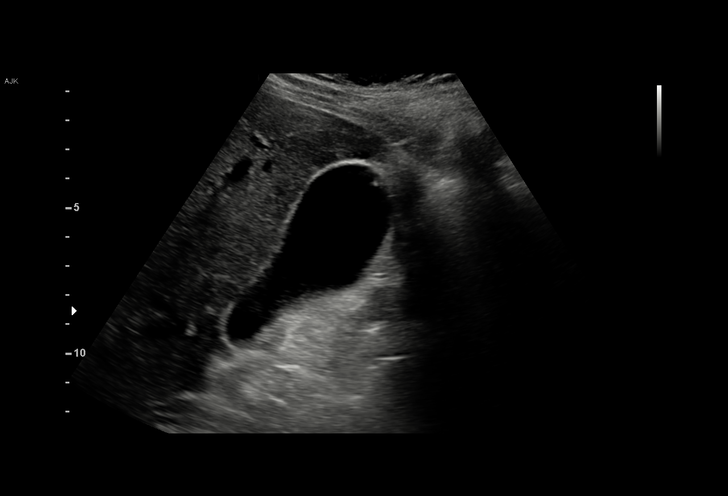
[im 37/53]
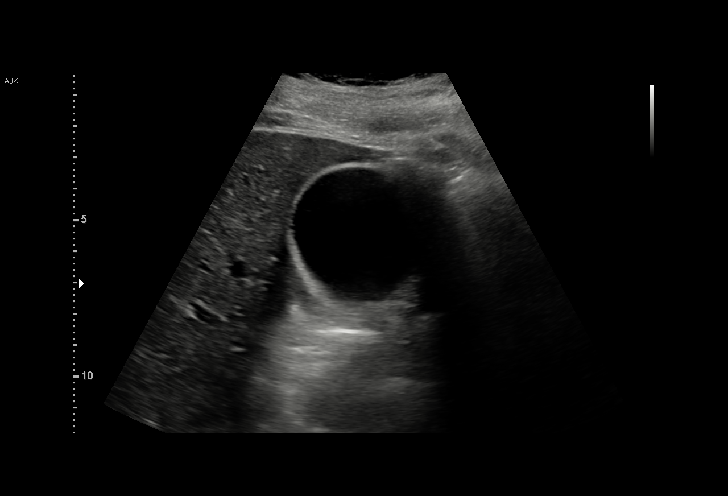
[im 42/53]
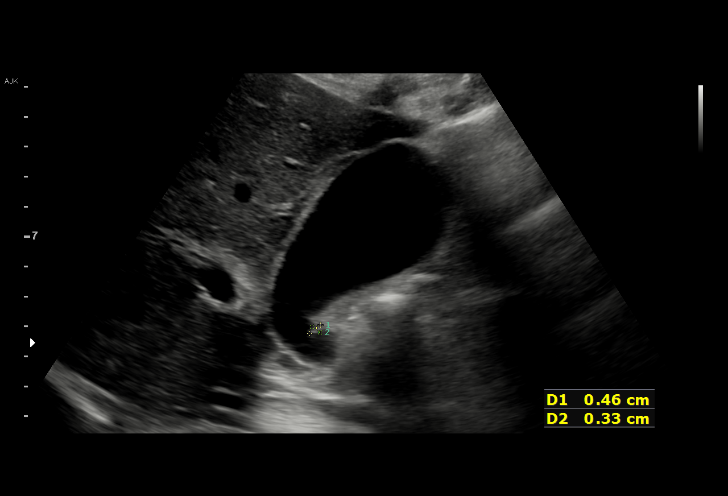
[im 44/53]
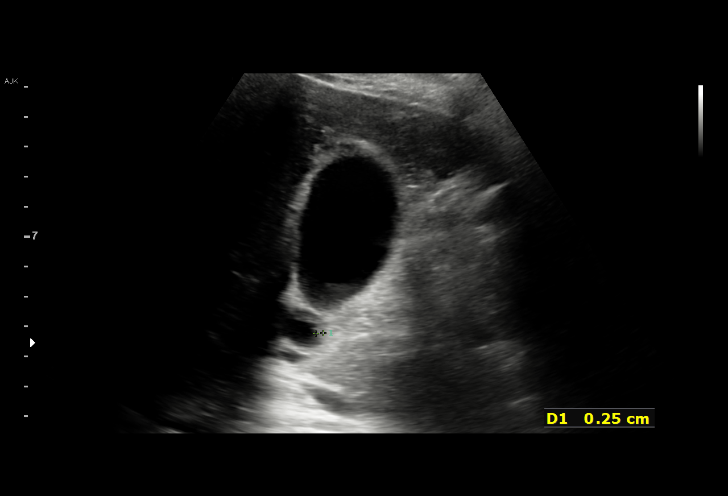
[im 48/53]
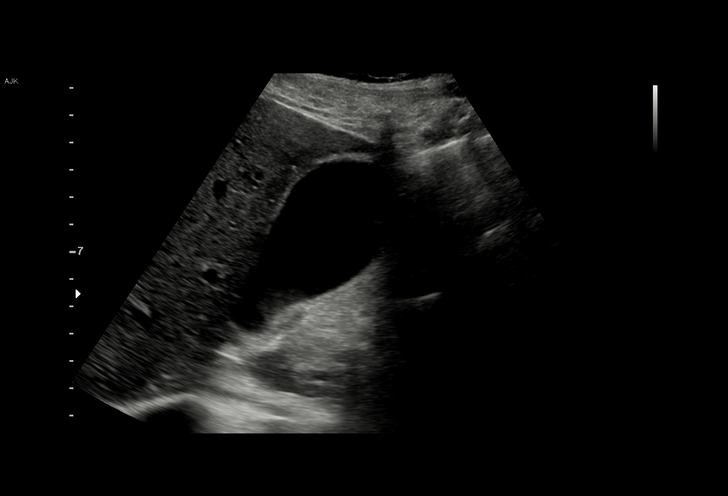
[im 53/53]
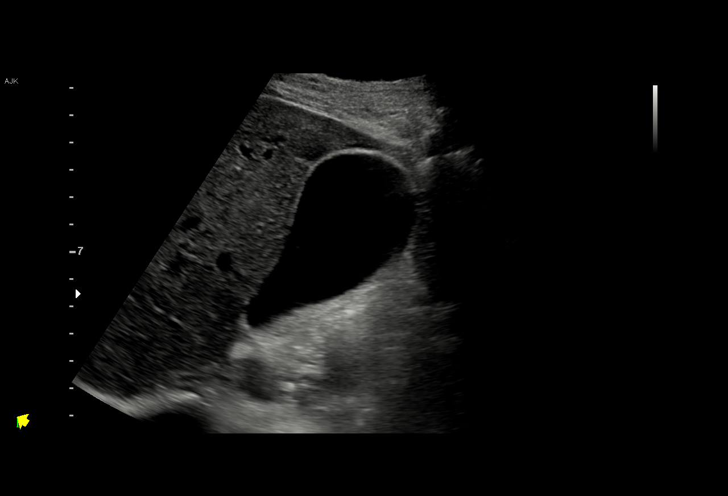

[15 of 25 positions shown; findings below may reference images not displayed]

FINDINGS: Gallbladder:

Resolution of gallbladder sludge. Presumed nonmobile stones of up to
5 mm. Gallbladder polyps are felt less likely, given mobile stones
on the prior ultrasound. No wall thickening or pericholecystic
fluid. Sonographic Murphy's sign was not elicited.

Common bile duct:

Diameter: Normal, 4 mm.

Liver:

No focal lesion identified. Within normal limits in parenchymal
echogenicity. Portal vein is patent on color Doppler imaging with
normal direction of blood flow towards the liver.

Other: None.
IMPRESSION: Cholelithiasis without cholecystitis or biliary duct dilatation.

## 2022-01-22 ENCOUNTER — Other Ambulatory Visit: Payer: Self-pay

## 2022-01-22 ENCOUNTER — Encounter (HOSPITAL_COMMUNITY): Payer: Self-pay | Admitting: Obstetrics & Gynecology

## 2022-01-22 ENCOUNTER — Inpatient Hospital Stay (HOSPITAL_COMMUNITY)
Admission: AD | Admit: 2022-01-22 | Discharge: 2022-01-23 | Disposition: A | Discharge status: Home / Self Care | Payer: Managed Care, Other (non HMO) | Attending: Obstetrics & Gynecology | Admitting: Obstetrics & Gynecology

## 2022-01-22 DIAGNOSIS — O471 False labor at or after 37 completed weeks of gestation: Secondary | ICD-10-CM | POA: Insufficient documentation

## 2022-01-22 DIAGNOSIS — Z3A40 40 weeks gestation of pregnancy: Secondary | ICD-10-CM | POA: Diagnosis not present

## 2022-01-22 DIAGNOSIS — O480 Post-term pregnancy: Secondary | ICD-10-CM | POA: Insufficient documentation

## 2022-01-22 NOTE — MAU Note (Signed)
.  Lisa Benton is a 31 y.o. at [redacted]w[redacted]d here in MAU reporting: contractions every 5 min that began at 1800. Denies SROM, vaginal bleeding or bloody show. Endorses + fetal movement. ? ?Pt was 4 cm on Friday. ?Onset of complaint: 1800 ?Pain score: 2 ?Vitals:  ? 01/22/22 2206  ?BP: 114/81  ?Pulse: 86  ?Resp: 18  ?Temp: 98.4 ?F (36.9 ?C)  ?SpO2: 96%  ?   ?FHT:130bpm ?Lab orders placed from triage:   mau labor ?

## 2022-01-22 NOTE — MAU Provider Note (Signed)
Ms. Florentina Marquart is a [id number] at [redacted]w[redacted]d seen in MAU for labor. RN labor check, not seen by provider. SVE by RN Dilation: 4 ?Effacement (%): 50 ?Station: -3 ?Exam by:: Ronney Lion RN  ? ?NST - FHR: 125 bpm / moderate variability / accels present / decels absent / TOCO: regular every 4 mins  ? ?Plan: ? ?D/C home with labor precautions ?Keep scheduled appt with [facility] next week ? ?Raelyn Mora, CNM  ?01/22/2022 11:55 PM  ? ?

## 2022-01-22 NOTE — Discharge Instructions (Signed)
2/3-1-1 Rule Go to MAU for painful contractions every 2-3 minutes, lasting 1 minute each for 1.5 hours.  

## 2022-01-23 DIAGNOSIS — O471 False labor at or after 37 completed weeks of gestation: Secondary | ICD-10-CM | POA: Diagnosis not present

## 2022-01-23 DIAGNOSIS — O480 Post-term pregnancy: Secondary | ICD-10-CM | POA: Diagnosis not present

## 2022-01-23 DIAGNOSIS — Z3A40 40 weeks gestation of pregnancy: Secondary | ICD-10-CM | POA: Diagnosis not present

## 2022-01-28 ENCOUNTER — Other Ambulatory Visit: Payer: Self-pay | Admitting: Obstetrics and Gynecology

## 2022-01-29 ENCOUNTER — Other Ambulatory Visit: Payer: Self-pay

## 2022-01-29 ENCOUNTER — Inpatient Hospital Stay (HOSPITAL_COMMUNITY)
Admission: AD | Admit: 2022-01-29 | Discharge: 2022-01-31 | DRG: 807 | Disposition: A | Discharge status: Home / Self Care | Payer: Managed Care, Other (non HMO) | Attending: Obstetrics and Gynecology | Admitting: Obstetrics and Gynecology

## 2022-01-29 ENCOUNTER — Inpatient Hospital Stay (HOSPITAL_COMMUNITY): Payer: Managed Care, Other (non HMO)

## 2022-01-29 ENCOUNTER — Encounter (HOSPITAL_COMMUNITY): Payer: Self-pay | Admitting: Obstetrics and Gynecology

## 2022-01-29 DIAGNOSIS — O480 Post-term pregnancy: Principal | ICD-10-CM | POA: Diagnosis present

## 2022-01-29 DIAGNOSIS — Z3A41 41 weeks gestation of pregnancy: Secondary | ICD-10-CM | POA: Diagnosis not present

## 2022-01-29 LAB — CBC
HCT: 38.6 % (ref 36.0–46.0)
Hemoglobin: 13 g/dL (ref 12.0–15.0)
MCH: 29.5 pg (ref 26.0–34.0)
MCHC: 33.7 g/dL (ref 30.0–36.0)
MCV: 87.7 fL (ref 80.0–100.0)
Platelets: 190 10*3/uL (ref 150–400)
RBC: 4.4 MIL/uL (ref 3.87–5.11)
RDW: 14 % (ref 11.5–15.5)
WBC: 9.7 10*3/uL (ref 4.0–10.5)
nRBC: 0 % (ref 0.0–0.2)

## 2022-01-29 LAB — TYPE AND SCREEN
ABO/RH(D): A NEG
Antibody Screen: NEGATIVE

## 2022-01-29 MED ORDER — PHENYLEPHRINE 80 MCG/ML (10ML) SYRINGE FOR IV PUSH (FOR BLOOD PRESSURE SUPPORT)
80.0000 ug | PREFILLED_SYRINGE | INTRAVENOUS | Status: AC | PRN
  Administered 2022-01-30 (×3): 80 ug via INTRAVENOUS

## 2022-01-29 MED ORDER — FENTANYL-BUPIVACAINE-NACL 0.5-0.125-0.9 MG/250ML-% EP SOLN
12.0000 mL/h | EPIDURAL | Status: DC | PRN
  Filled 2022-01-29: qty 250

## 2022-01-29 MED ORDER — PHENYLEPHRINE 80 MCG/ML (10ML) SYRINGE FOR IV PUSH (FOR BLOOD PRESSURE SUPPORT)
80.0000 ug | PREFILLED_SYRINGE | INTRAVENOUS | Status: AC | PRN
  Administered 2022-01-30 (×3): 80 ug via INTRAVENOUS
  Filled 2022-01-29: qty 10

## 2022-01-29 MED ORDER — OXYTOCIN-SODIUM CHLORIDE 30-0.9 UT/500ML-% IV SOLN: 2.5000 [IU]/h | INTRAVENOUS | Status: DC

## 2022-01-29 MED ORDER — LACTATED RINGERS IV SOLN
500.0000 mL | Freq: Once | INTRAVENOUS | Status: AC
  Administered 2022-01-30: 500 mL via INTRAVENOUS

## 2022-01-29 MED ORDER — DIPHENHYDRAMINE HCL 50 MG/ML IJ SOLN: 12.5000 mg | INTRAMUSCULAR | Status: DC | PRN

## 2022-01-29 MED ORDER — OXYCODONE-ACETAMINOPHEN 5-325 MG PO TABS: 1.0000 | ORAL_TABLET | ORAL | Status: DC | PRN

## 2022-01-29 MED ORDER — LACTATED RINGERS IV SOLN: INTRAVENOUS | Status: DC

## 2022-01-29 MED ORDER — EPHEDRINE 5 MG/ML INJ: 10.0000 mg | INTRAVENOUS | Status: DC | PRN

## 2022-01-29 MED ORDER — SOD CITRATE-CITRIC ACID 500-334 MG/5ML PO SOLN
30.0000 mL | ORAL | Status: DC | PRN
  Administered 2022-01-30: 30 mL via ORAL
  Filled 2022-01-29: qty 30

## 2022-01-29 MED ORDER — OXYTOCIN BOLUS FROM INFUSION
333.0000 mL | Freq: Once | INTRAVENOUS | Status: AC
  Administered 2022-01-30: 333 mL via INTRAVENOUS

## 2022-01-29 MED ORDER — ONDANSETRON HCL 4 MG/2ML IJ SOLN: 4.0000 mg | Freq: Four times a day (QID) | INTRAMUSCULAR | Status: DC | PRN

## 2022-01-29 MED ORDER — LIDOCAINE HCL (PF) 1 % IJ SOLN: 30.0000 mL | INTRAMUSCULAR | Status: DC | PRN

## 2022-01-29 MED ORDER — TERBUTALINE SULFATE 1 MG/ML IJ SOLN: 0.2500 mg | Freq: Once | INTRAMUSCULAR | Status: DC | PRN

## 2022-01-29 MED ORDER — OXYTOCIN-SODIUM CHLORIDE 30-0.9 UT/500ML-% IV SOLN
1.0000 m[IU]/min | INTRAVENOUS | Status: DC
  Administered 2022-01-29: 2 m[IU]/min via INTRAVENOUS
  Filled 2022-01-29: qty 500

## 2022-01-29 MED ORDER — HYDROXYZINE HCL 50 MG PO TABS
50.0000 mg | ORAL_TABLET | Freq: Four times a day (QID) | ORAL | Status: DC | PRN
Start: 2022-01-29 — End: 2022-01-30

## 2022-01-29 MED ORDER — ACETAMINOPHEN 325 MG PO TABS
650.0000 mg | ORAL_TABLET | ORAL | Status: DC | PRN
  Administered 2022-01-30: 650 mg via ORAL
  Filled 2022-01-29: qty 2

## 2022-01-29 MED ORDER — LACTATED RINGERS IV SOLN
500.0000 mL | INTRAVENOUS | Status: DC | PRN
  Administered 2022-01-30 (×3): 500 mL via INTRAVENOUS

## 2022-01-29 MED ORDER — OXYCODONE-ACETAMINOPHEN 5-325 MG PO TABS: 2.0000 | ORAL_TABLET | ORAL | Status: DC | PRN

## 2022-01-29 MED ORDER — FENTANYL CITRATE (PF) 100 MCG/2ML IJ SOLN
50.0000 ug | INTRAMUSCULAR | Status: DC | PRN
  Filled 2022-01-29: qty 2

## 2022-01-29 NOTE — H&P (Signed)
Lisa Benton is a 31 y.o. female G4P1021 at 58 weeks and 1 day presenting for induction of labor due to postdates. Pregnancy uncomplicated. Pt refused glucola. HGB A1c was normal. Prenatal care provided by Dr. Christophe Louis with [facility].  ?. ?OB History   ? ? Gravida  ?4  ? Para  ?1  ? Term  ?1  ? Preterm  ?   ? AB  ?2  ? Living  ?1  ?  ? ? SAB  ?2  ? IAB  ?   ? Ectopic  ?   ? Multiple  ?0  ? Live Births  ?1  ?   ?  ?  ? ?Past Medical History:  ?Diagnosis Date  ? Chronic kidney disease   ? Kidney stones  ? Gall stones   ? Headache   ? Migraines  ? History of kidney stones   ? PONV (postoperative nausea and vomiting)   ? Thoracic outlet syndrome   ? ?Past Surgical History:  ?Procedure Laterality Date  ? CHOLECYSTECTOMY N/A 09/07/2020  ? Procedure: LAPAROSCOPIC CHOLECYSTECTOMY WITH INTRAOPERATIVE CHOLANGIOGRAM;  Surgeon: Jovita Kussmaul, MD;  Location: [facility];  Service: General;  Laterality: N/A;  ? FOOT SURGERY Left   ? KIDNEY STONE SURGERY    ? WISDOM TOOTH EXTRACTION    ? ?Family History: family history includes Anxiety disorder in her mother; Cancer in her maternal grandfather; Depression in her mother; Diabetes in her father; Mental illness in her mother. ?Social History:  reports that she has never smoked. She has never used smokeless tobacco. She reports that she does not currently use alcohol. She reports that she does not use drugs. ? ? ?  ?Maternal Diabetes: No ?Genetic Screening: Declined ?Maternal Ultrasounds/Referrals: Normal ?Fetal Ultrasounds or other Referrals:  None ?Maternal Substance Abuse:  No ?Significant Maternal Medications:  None ?Significant Maternal Lab Results:  Group B Strep negative ?Other Comments:  None ? ?Review of Systems  ?Constitutional: Negative.   ?HENT: Negative.    ?Eyes: Negative.   ?Respiratory: Negative.    ?Cardiovascular: Negative.   ?Gastrointestinal: Negative.   ?Endocrine: Negative.   ?Genitourinary: Negative.   ?Musculoskeletal: Negative.   ?Skin: Negative.    ?Allergic/Immunologic: Negative.   ?Neurological: Negative.   ?Hematological: Negative.   ?Psychiatric/Behavioral: Negative.    ?History ?  ?unknown if currently breastfeeding. ?Maternal Exam:  ?Introitus: Normal vulva.  ?Physical Exam ?Vitals reviewed.  ?Constitutional:   ?   Appearance: Normal appearance.  ?HENT:  ?   Head: Normocephalic and atraumatic.  ?   Nose: Nose normal.  ?   Mouth/Throat:  ?   Mouth: Mucous membranes are moist.  ?Eyes:  ?   Pupils: Pupils are equal, round, and reactive to light.  ?Cardiovascular:  ?   Rate and Rhythm: Normal rate and regular rhythm.  ?   Pulses: Normal pulses.  ?   Heart sounds: Normal heart sounds.  ?Pulmonary:  ?   Effort: Pulmonary effort is normal.  ?   Breath sounds: Normal breath sounds.  ?Abdominal:  ?   Tenderness: There is no abdominal tenderness.  ?Genitourinary: ?   General: Normal vulva.  ?Musculoskeletal:     ?   General: Swelling present. Normal range of motion.  ?   Cervical back: Normal range of motion and neck supple.  ?Skin: ?   General: Skin is warm.  ?Neurological:  ?   General: No focal deficit present.  ?   Mental Status: She is alert and oriented to  person, place, and time.  ?Psychiatric:     ?   Mood and Affect: Mood normal.     ?   Behavior: Behavior normal.  ? Cervix is 4/50/-3 ? ?Prenatal labs: ?ABO, Rh:  A negative  ?Antibody:  Negative  ?Rubella:  Immune  ?RPR:   Nonreactive  ?HBsAg:   Negative  ?HIV:   Negative  ?GBS:   Negative  ? ?Assessment/Plan: ?41 weeks and 1 day for induction.  ?Pitocin 2x2 ?Plan AROM with fetal descent ?Category 1 tracing.  ?Anticipate SVD  ? ?Christophe Louis ?01/29/2022, 6:36 PM ? ? ? ? ?

## 2022-01-30 ENCOUNTER — Inpatient Hospital Stay (HOSPITAL_COMMUNITY): Payer: Managed Care, Other (non HMO) | Admitting: Anesthesiology

## 2022-01-30 ENCOUNTER — Encounter (HOSPITAL_COMMUNITY): Payer: Self-pay | Admitting: Obstetrics and Gynecology

## 2022-01-30 LAB — RPR: RPR Ser Ql: NONREACTIVE

## 2022-01-30 MED ORDER — SENNOSIDES-DOCUSATE SODIUM 8.6-50 MG PO TABS
2.0000 | ORAL_TABLET | Freq: Every day | ORAL | Status: DC
  Administered 2022-01-31: 2 via ORAL
  Filled 2022-01-30: qty 2

## 2022-01-30 MED ORDER — OXYTOCIN-SODIUM CHLORIDE 30-0.9 UT/500ML-% IV SOLN: 2.5000 [IU]/h | INTRAVENOUS | Status: DC | PRN

## 2022-01-30 MED ORDER — ACETAMINOPHEN 325 MG PO TABS: 650.0000 mg | ORAL_TABLET | ORAL | Status: DC | PRN

## 2022-01-30 MED ORDER — PRENATAL MULTIVITAMIN CH
1.0000 | ORAL_TABLET | Freq: Every day | ORAL | Status: DC
  Administered 2022-01-31: 1 via ORAL
  Filled 2022-01-30: qty 1

## 2022-01-30 MED ORDER — ZOLPIDEM TARTRATE 5 MG PO TABS: 5.0000 mg | ORAL_TABLET | Freq: Every evening | ORAL | Status: DC | PRN

## 2022-01-30 MED ORDER — FENTANYL CITRATE (PF) 100 MCG/2ML IJ SOLN
INTRAMUSCULAR | Status: DC | PRN
  Administered 2022-01-30: 100 ug via EPIDURAL

## 2022-01-30 MED ORDER — ONDANSETRON HCL 4 MG PO TABS: 4.0000 mg | ORAL_TABLET | ORAL | Status: DC | PRN

## 2022-01-30 MED ORDER — DIBUCAINE (PERIANAL) 1 % EX OINT: 1.0000 "application " | TOPICAL_OINTMENT | CUTANEOUS | Status: DC | PRN

## 2022-01-30 MED ORDER — LIDOCAINE HCL (PF) 1 % IJ SOLN
INTRAMUSCULAR | Status: DC | PRN
  Administered 2022-01-30: 2 mL via EPIDURAL
  Administered 2022-01-30: 10 mL via EPIDURAL

## 2022-01-30 MED ORDER — ONDANSETRON HCL 4 MG/2ML IJ SOLN: 4.0000 mg | INTRAMUSCULAR | Status: DC | PRN

## 2022-01-30 MED ORDER — LACTATED RINGERS AMNIOINFUSION
INTRAVENOUS | Status: DC
Start: 2022-01-30 — End: 2022-01-30

## 2022-01-30 MED ORDER — TETANUS-DIPHTH-ACELL PERTUSSIS 5-2.5-18.5 LF-MCG/0.5 IM SUSY: 0.5000 mL | PREFILLED_SYRINGE | Freq: Once | INTRAMUSCULAR | Status: DC

## 2022-01-30 MED ORDER — DIPHENHYDRAMINE HCL 25 MG PO CAPS: 25.0000 mg | ORAL_CAPSULE | Freq: Four times a day (QID) | ORAL | Status: DC | PRN

## 2022-01-30 MED ORDER — COCONUT OIL OIL
1.0000 "application " | TOPICAL_OIL | Status: DC | PRN
  Administered 2022-01-31: 1 via TOPICAL

## 2022-01-30 MED ORDER — SERTRALINE HCL 50 MG PO TABS
50.0000 mg | ORAL_TABLET | Freq: Every day | ORAL | Status: DC
  Administered 2022-01-30 – 2022-01-31 (×2): 50 mg via ORAL
  Filled 2022-01-30 (×2): qty 1

## 2022-01-30 MED ORDER — FENTANYL-BUPIVACAINE-NACL 0.5-0.125-0.9 MG/250ML-% EP SOLN
EPIDURAL | Status: DC | PRN
  Administered 2022-01-30: 12 mL/h via EPIDURAL

## 2022-01-30 MED ORDER — IBUPROFEN 600 MG PO TABS
600.0000 mg | ORAL_TABLET | Freq: Four times a day (QID) | ORAL | Status: DC
  Administered 2022-01-30 – 2022-01-31 (×5): 600 mg via ORAL
  Filled 2022-01-30 (×5): qty 1

## 2022-01-30 MED ORDER — SIMETHICONE 80 MG PO CHEW: 80.0000 mg | CHEWABLE_TABLET | ORAL | Status: DC | PRN

## 2022-01-30 MED ORDER — BENZOCAINE-MENTHOL 20-0.5 % EX AERO
1.0000 "application " | INHALATION_SPRAY | CUTANEOUS | Status: DC | PRN
  Filled 2022-01-30: qty 56

## 2022-01-30 MED ORDER — WITCH HAZEL-GLYCERIN EX PADS: 1.0000 "application " | MEDICATED_PAD | CUTANEOUS | Status: DC | PRN

## 2022-01-30 MED ORDER — LACTATED RINGERS IV SOLN: INTRAVENOUS | Status: DC

## 2022-01-30 NOTE — Anesthesia Procedure Notes (Signed)
Epidural ?Patient location during procedure: OB ?Start time: 01/30/2022 3:17 AM ?End time: 01/30/2022 3:28 AM ? ?Staffing ?Anesthesiologist: Pervis Hocking, DO ?Performed: anesthesiologist  ? ?Preanesthetic Checklist ?Completed: patient identified, IV checked, risks and benefits discussed, monitors and equipment checked, pre-op evaluation and timeout performed ? ?Epidural ?Patient position: sitting ?Prep: DuraPrep and site prepped and draped ?Patient monitoring: continuous pulse ox, blood pressure, heart rate and cardiac monitor ?Approach: midline ?Location: L3-L4 ?Injection technique: LOR air ? ?Needle:  ?Needle type: Tuohy  ?Needle gauge: 17 G ?Needle length: 9 cm ?Needle insertion depth: 5 cm ?Catheter type: closed end flexible ?Catheter size: 19 Gauge ?Catheter at skin depth: 10 cm ?Test dose: negative ? ?Assessment ?Sensory level: T8 ?Events: blood not aspirated, injection not painful, no injection resistance, no paresthesia and negative IV test ? ?Additional Notes ?Patient identified. Risks/Benefits/Options discussed with patient including but not limited to bleeding, infection, nerve damage, paralysis, failed block, incomplete pain control, headache, blood pressure changes, nausea, vomiting, reactions to medication both or allergic, itching and postpartum back pain. Confirmed with bedside nurse the patient's most recent platelet count. Confirmed with patient that they are not currently taking any anticoagulation, have any bleeding history or any family history of bleeding disorders. Patient expressed understanding and wished to proceed. All questions were answered. Sterile technique was used throughout the entire procedure. Please see nursing notes for vital signs. Test dose was given through epidural catheter and negative prior to continuing to dose epidural or start infusion. Warning signs of high block given to the patient including shortness of breath, tingling/numbness in hands, complete motor  block, or any concerning symptoms with instructions to call for help. Patient was given instructions on fall risk and not to get out of bed. All questions and concerns addressed with instructions to call with any issues or inadequate analgesia.  Reason for block:procedure for pain ? ? ? ?

## 2022-01-30 NOTE — Anesthesia Preprocedure Evaluation (Signed)
Anesthesia Evaluation  ?Patient identified by MRN, date of birth, ID band ?Patient awake ? ? ? ?Reviewed: ?Allergy & Precautions, Patient's Chart, lab work & pertinent test results ? ?History of Anesthesia Complications ?(+) PONV and history of anesthetic complications (per pt, requires more than normal amount of anesthesia. charting for [place] in 2021 does not reveal any increased requirements) ? ?Airway ?Mallampati: II ? ?TM Distance: >3 FB ?Neck ROM: Full ? ? ? Dental ?no notable dental hx. ? ?  ?Pulmonary ?neg pulmonary ROS,  ?  ?Pulmonary exam normal ?breath sounds clear to auscultation ? ? ? ? ? ? Cardiovascular ?negative cardio ROS ?Normal cardiovascular exam ?Rhythm:Regular Rate:Normal ? ? ?  ?Neuro/Psych ?negative neurological ROS ? negative psych ROS  ? GI/Hepatic ?negative GI ROS, Neg liver ROS,   ?Endo/Other  ?BMI 34 ? Renal/GU ?negative Renal ROS  ?negative genitourinary ?  ?Musculoskeletal ?negative musculoskeletal ROS ?(+)  ? Abdominal ?  ?Peds ?negative pediatric ROS ?(+)  Hematology ?negative hematology ROS ?(+) Hb 13, plt 190   ?Anesthesia Other Findings ? ? Reproductive/Obstetrics ?(+) Pregnancy ?Per pt, epidural in 2021 was one-sided. Charting reveals one time bolus given during epidural infusion for vaginal pain ? ?  ? ? ? ? ? ? ? ? ? ? ? ? ? ?  ?  ? ? ? ? ? ? ? ? ?Anesthesia Physical ?Anesthesia Plan ? ?ASA: 2 ? ?Anesthesia Plan: Epidural  ? ?Post-op Pain Management:   ? ?Induction:  ? ?PONV Risk Score and Plan: 2 ? ?Airway Management Planned: Natural Airway ? ?Additional Equipment: None ? ?Intra-op Plan:  ? ?Post-operative Plan:  ? ?Informed Consent: I have reviewed the patients History and Physical, chart, labs and discussed the procedure including the risks, benefits and alternatives for the proposed anesthesia with the patient or authorized representative who has indicated his/her understanding and acceptance.  ? ? ? ? ? ?Plan Discussed with:  ? ?Anesthesia  Plan Comments:   ? ? ? ? ? ? ?Anesthesia Quick Evaluation ? ?

## 2022-01-30 NOTE — Progress Notes (Signed)
Lisa Benton is a 31 y.o. [phone] at [redacted]w[redacted]d by ultrasound admitted for induction of labor due to Post dates. Due date 01/21/2022. ? ?Subjective: ? She is comfortable with  her epidural. Continued LOF  ? ?Objective: ?BP 117/64   Pulse 89   Temp 98.6 ?F (37 ?C) (Oral)   Resp 16   Ht 5\' 5"  (1.651 m)   Wt 92.7 kg   BMI 34.00 kg/m?  ?No intake/output data recorded. ?No intake/output data recorded. ? ?FHT:  FHR: 130 bpm, variability: moderate,  accelerations:  Present,  decelerations:  Present variable  ?UC:   regular, every 3 minutes ?SVE:   Dilation: 6 ?Effacement (%): 70 ?Station: -3 ?Exam by:: [email or web address], MD ? ?Labs: ?Lab Results  ?Component Value Date  ? WBC 9.7 01/29/2022  ? HGB 13.0 01/29/2022  ? HCT 38.6 01/29/2022  ? MCV 87.7 01/29/2022  ? PLT 190 01/29/2022  ? ? ?Assessment / Plan: ?Induction of labor due to postdates.. fhr is category 1 now will restart pitocin 2x2 ? ?Labor:  restart pitocin to reach mvu's of 200  ?Preeclampsia:   NA ?Fetal Wellbeing:  Category I and Category II mostly category 1  ?Pain Control:  Epidural ?I/D:  n/a ?Anticipated MOD:  NSVD ?Dr. 01/31/2022 assuming care at 7 am this morning ? ?Lisa Benton ?01/30/2022, 6:22 AM ? ? ?

## 2022-01-30 NOTE — Progress Notes (Signed)
Lisa Benton is a 31 y.o. [phone] at [redacted]w[redacted]d by ultrasound admitted for induction of labor due to Post dates. Due date 01/21/2022. ? ?Subjective: ? Pt rates contractions as 2-3.. no lof no vaginal bleeding. Lots of FM ? ?Objective: ?BP (!) 113/59   Pulse 68   Temp 98 ?F (36.7 ?C) (Oral)   Resp 18   Ht 5\' 5"  (1.651 m)   Wt 92.7 kg   BMI 34.00 kg/m?  ?No intake/output data recorded. ?No intake/output data recorded. ? ?FHT:  FHR: 150 bpm, variability: moderate,  accelerations:  Present,  decelerations:  Present variable  ?UC:   regular, every 2-3 minutes ?SVE:   Dilation: 5 ?Effacement (%): 70 ?Station: -3 ?Exam by:: [email or web address], MD ?AROM clear fluid IUPC placed.  ? ?Labs: ?Lab Results  ?Component Value Date  ? WBC 9.7 01/29/2022  ? HGB 13.0 01/29/2022  ? HCT 38.6 01/29/2022  ? MCV 87.7 01/29/2022  ? PLT 190 01/29/2022  ? ? ?Assessment / Plan: ?Induction of labor due to postterm,  progressing well on pitocin ? ?Labor: Progressing normally ?Preeclampsia:   NA ?Fetal Wellbeing:  Category I and Category II pt with variable decels will start amnioinfusion  ?Pain Control:   pt planning epidural  ?I/D:  n/a ?Anticipated MOD:  NSVD ? ?01/31/2022 ?01/30/2022, 2:10 AM ? ? ?

## 2022-01-30 NOTE — Lactation Note (Signed)
This note was copied from a baby's chart. ?Lactation Consultation Note ?Lisa Benton had just finished BF baby. Lisa Benton stated baby is BF well. Baby has spitting up several times and spit up while LC was in room. LC used bulb syring to clear mouth. ?Gave baby to FOB to hold upright for a few minutes until baby settles down not spitting up. ?Newborn feeding habits, STS, I&O reviewed. ?Lisa Benton encouraged to feed baby 8-12 times/24 hours and with feeding cues.   ?Encouraged Lisa Benton to call for [facility] assistance tonight to see a latch. ? ?Patient Name: Lisa Benton ?Today's Date: 01/30/2022 ?Reason for consult: Initial assessment;Term ?Age:78 hours ? ?Maternal Data ?Has patient been taught Hand Expression?: Yes ?Does the patient have breastfeeding experience prior to this delivery?: Yes ?How long did the patient breastfeed?: 2 weeks ? ?Feeding ?  ? ?LATCH Score ?  ? ?  ? ?Type of Nipple: Everted at rest and after stimulation ? ?Comfort (Breast/Nipple): Soft / non-tender ? ?  ? ?  ? ? ?Lactation Tools Discussed/Used ?  ? ?Interventions ?Interventions: Breast feeding basics reviewed;Hand express;LC Services brochure ? ?Discharge ?  ? ?Consult Status ?Consult Status: Follow-up ?Date: 01/31/22 ?Follow-up type: In-patient ? ? ? ?Charyl Dancer ?01/30/2022, 8:50 PM ? ? ? ?

## 2022-01-30 NOTE — Progress Notes (Signed)
MD LABOR PROGRESS NOTE ? ?Lisa Benton is a 31 y.o. [phone] at [redacted]w[redacted]d  admitted for induction of labor due to Post dates.   ? ?Subjective:  ?Patient comfortable with epidural ? ?Objective:  ?BP 123/83   Pulse 78   Temp 98.5 ?F (36.9 ?C) (Oral)   Resp 18   Ht 5\' 5"  (1.651 m)   Wt 92.7 kg   BMI 34.00 kg/m?  ? ?Total I/O ?In: -  ?Out: [id number] ? ?FHT:  FHR: 140 bpm, variability: moderate,  accelerations:  Present,  decelerations:  Present occasional small early variable decels with ctx ?UC:   regular, every 3-5 minutes ?SVE:   Dilation: 10 ?Effacement (%): 100 ?Station: -1 ?Exam by:: J Hazelwood ?AROM: clear fluid ? ?Pitocin @ 8 mu/min ? ?Labs: ?Lab Results  ?Component Value Date  ? WBC 9.7 01/29/2022  ? HGB 13.0 01/29/2022  ? HCT 38.6 01/29/2022  ? MCV 87.7 01/29/2022  ? PLT 190 01/29/2022  ? ? ?Assessment / Plan: ?31 y.o. [id number] [redacted]w[redacted]d now in second stage of labor ?Will continue to increase pitocin as head needs to descend. ? ?Labor: Progressing normally ?Fetal Wellbeing:  Category I ?Pain Control:  Epidural ?Anticipated MOD:  NSVD ? ?Expectant management ? ? ?Sanjuana Kava, MD  ?01/30/2022 ?11:41 AM ?  ?

## 2022-01-30 NOTE — Lactation Note (Signed)
This note was copied from a baby's chart. ?Lactation Consultation Note ? ?Patient Name: Girl Shelsey Rieth ?Today's Date: 01/30/2022 ?Reason for consult: L&D Initial assessment;Term ?Age:29 hours ? ? ?Initial L&D Consult: ? ?Visited with family < 1 hour after birth ?Assisted "Mavis" to latch easily; observed her feeding for 5 minutes prior to my departure.  Reassured parents that lactation services will be available on the M/B unit.  Allowed time for family bonding.  Father at bedside. ? ? ?Maternal Data ?  ? ?Feeding ?Mother's Current Feeding Choice: Breast Milk ? ?LATCH Score ?Latch: Grasps breast easily, tongue down, lips flanged, rhythmical sucking. ? ?Audible Swallowing: None ? ?Type of Nipple: Everted at rest and after stimulation ? ?Comfort (Breast/Nipple): Soft / non-tender ? ?Hold (Positioning): Assistance needed to correctly position infant at breast and maintain latch. ? ?LATCH Score: 7 ? ? ?Lactation Tools Discussed/Used ?  ? ?Interventions ?Interventions: Assisted with latch;Skin to skin ? ?Discharge ?  ? ?Consult Status ?Consult Status: Follow-up from L&D ? ? ? ?Lillyen Schow R Sunni Richardson ?01/30/2022, 2:05 PM ? ? ? ?

## 2022-01-30 NOTE — Lactation Note (Signed)
This note was copied from a baby's chart. ?Lactation Consultation Note ?Mom called d/t baby has been BF for an hour. ?LC heard swallows when breast compressed. ?Encouraged occasional breast massage during feeding. Mom stated baby is having to be held upright in stead of laying down d/t baby spitting up. ?Nipple shaped normal round when baby came off of breast. ?Praised mom for good BF. ? ?Patient Name: Lisa Benton ?Today's Date: 01/30/2022 ?Reason for consult: Mother's request;Term ?Age:29 hours ? ?Maternal Data ?Has patient been taught Hand Expression?: Yes ?Does the patient have breastfeeding experience prior to this delivery?: Yes ?How long did the patient breastfeed?: 2 weeks ? ?Feeding ?  ? ?LATCH Score ?Latch: Grasps breast easily, tongue down, lips flanged, rhythmical sucking. ? ?Audible Swallowing: A few with stimulation ? ?Type of Nipple: Everted at rest and after stimulation ? ?Comfort (Breast/Nipple): Soft / non-tender ? ?Hold (Positioning): No assistance needed to correctly position infant at breast. ? ?LATCH Score: 9 ? ? ?Lactation Tools Discussed/Used ?  ? ?Interventions ?Interventions: Breast feeding basics reviewed;Hand express;LC Services brochure ? ?Discharge ?  ? ?Consult Status ?Consult Status: Follow-up ?Date: 01/31/22 ?Follow-up type: In-patient ? ? ? ?Charyl Dancer ?01/30/2022, 10:40 PM ? ? ? ?

## 2022-01-31 LAB — CBC
HCT: 31.2 % — ABNORMAL LOW (ref 36.0–46.0)
Hemoglobin: 10.3 g/dL — ABNORMAL LOW (ref 12.0–15.0)
MCH: 29.4 pg (ref 26.0–34.0)
MCHC: 33 g/dL (ref 30.0–36.0)
MCV: 89.1 fL (ref 80.0–100.0)
Platelets: 132 10*3/uL — ABNORMAL LOW (ref 150–400)
RBC: 3.5 MIL/uL — ABNORMAL LOW (ref 3.87–5.11)
RDW: 14.3 % (ref 11.5–15.5)
WBC: 8.7 10*3/uL (ref 4.0–10.5)
nRBC: 0 % (ref 0.0–0.2)

## 2022-01-31 LAB — BIRTH TISSUE RECOVERY COLLECTION (PLACENTA DONATION)

## 2022-01-31 MED ORDER — IBUPROFEN 600 MG PO TABS
600.0000 mg | ORAL_TABLET | Freq: Four times a day (QID) | ORAL | 0 refills | Status: DC | PRN
  Filled 2022-01-31: qty 30, 8d supply, fill #0

## 2022-01-31 MED ORDER — ACETAMINOPHEN 325 MG PO TABS: 650.0000 mg | ORAL_TABLET | ORAL | Status: DC | PRN

## 2022-01-31 MED ORDER — RHO D IMMUNE GLOBULIN 1500 UNIT/2ML IJ SOSY
300.0000 ug | PREFILLED_SYRINGE | Freq: Once | INTRAMUSCULAR | Status: AC
  Administered 2022-01-31: 300 ug via INTRAVENOUS
  Filled 2022-01-31: qty 2

## 2022-01-31 NOTE — Discharge Summary (Signed)
? ?  Postpartum Discharge Summary ? ?Date of Service updated 01/31/2022 ? ?   ?Patient Name: Lisa Benton ?DOB: 1991/03/14 ?MRN: [id number] ? ?Date of admission: 01/29/2022 ?Delivery date:01/30/2022  ?Delivering provider: Sanjuana Kava  ?Date of discharge: 01/31/2022 ? ?Admitting diagnosis: Post term pregnancy [O48.0] ?Intrauterine pregnancy: [redacted]w[redacted]d    ?Secondary diagnosis:  Principal Problem: ?  Post term pregnancy ? ?Additional problems: None    ?Discharge diagnosis: Term Pregnancy Delivered                                              ?Post partum procedures: None ?Augmentation: AROM and Pitocin ?Complications: None ? ?Hospital course: Induction of Labor With Vaginal Delivery   ?31y.o. yo [id number] 435w2das admitted to the hospital 01/29/2022 for induction of labor.  Indication for induction: Postdates.  Patient had an uncomplicated labor course as follows: ?Membrane Rupture Time/Date: 1:27 AM ,01/30/2022   ?Delivery Method:Vaginal, Spontaneous  ?Episiotomy: None  ?Lacerations:  1st degree  ?Details of delivery can be found in separate delivery note.  Patient had a routine postpartum course. Patient is discharged home 02/10/22. ? ?Newborn Data: ?Birth date:01/30/2022  ?Birth time:1:19 PM  ?Gender:Female  ?Living status:Living  ?Apgars:9 ,9  ?Weight:3.94 kg  ? ?Magnesium Sulfate received: No ?BMZ received: No ?Rhophylac:yes ?MMR:N/A ?T-DaP:Given prenatally ?Flu: N/A ?Transfusion:No ? ?Physical exam  ?Vitals:  ? 01/30/22 1804 01/30/22 2315 01/31/22 0240 01/31/22 0650  ?BP: 117/73 105/67 122/74 124/72  ?Pulse: 68 74 72 74  ?Resp: '18 16 16 18  ' ?Temp: 98 ?F (36.7 ?C) 98 ?F (36.7 ?C) 98.2 ?F (36.8 ?C) 98.4 ?F (36.9 ?C)  ?TempSrc:  Oral Oral Oral  ?SpO2:  98% 97% 98%  ?Weight:      ?Height:      ? ?General: alert, cooperative, and no distress ?Lochia: appropriate ?Uterine Fundus: firm ?Incision: N/A ?DVT Evaluation: No evidence of DVT seen on physical exam. ?Labs: ?Lab Results  ?Component Value Date  ? WBC 8.7 01/31/2022  ?  HGB 10.3 (L) 01/31/2022  ? HCT 31.2 (L) 01/31/2022  ? MCV 89.1 01/31/2022  ? PLT 132 (L) 01/31/2022  ? ? ?  Latest Ref Rng & Units 08/28/2020  ?  9:25 AM  ?CMP  ?Glucose 70 - 99 mg/dL 95    ?BUN 6 - 20 mg/dL 9    ?Creatinine 0.44 - 1.00 mg/dL 0.77    ?Sodium 135 - 145 mmol/L 140    ?Potassium 3.5 - 5.1 mmol/L 3.8    ?Chloride 98 - 111 mmol/L 105    ?CO2 22 - 32 mmol/L 22    ?Calcium 8.9 - 10.3 mg/dL 8.7    ? ?Edinburgh Score: ? ?  08/21/2020  ?  5:26 PM  ?EdFlavia Shipperostnatal Depression Scale Screening Tool  ?I have been able to laugh and see the funny side of things. 0  ?I have looked forward with enjoyment to things. 0  ?I have blamed myself unnecessarily when things went wrong. 2  ?I have been anxious or worried for no good reason. 2  ?I have felt scared or panicky for no good reason. 1  ?Things have been getting on top of me. 1  ?I have been so unhappy that I have had difficulty sleeping. 0  ?I have felt sad or miserable. 0  ?I have been so unhappy that I have been crying. 0  ?  The thought of harming myself has occurred to me. 0  ?Edinburgh Postnatal Depression Scale Total 6  ? ? ? ? ?After visit meds:  ?Allergies as of 01/31/2022   ? ?   Reactions  ? Other Anaphylaxis, Other (See Comments)  ? preservatives ?Stinging or biting creatures (insects, jellyfish)  ? Penicillins Anaphylaxis  ? Shellfish Allergy Anaphylaxis, Rash  ? Cephalexin Rash, Swelling  ? Eggs Or Egg-derived Products Nausea And Vomiting, Other (See Comments)  ? Flu like symptoms- egg beaters okay  ? Sulfamethoxazole-trimethoprim Other (See Comments)  ? Secondary Yeast Infection  ? ?  ? ?  ?Medication List  ?  ? ?STOP taking these medications   ? ?promethazine 25 MG tablet ?Commonly known as: PHENERGAN ?  ? ?  ? ?TAKE these medications   ? ?acetaminophen 325 MG tablet ?Commonly known as: Tylenol ?Take 2 tablets (650 mg total) by mouth every 4 (four) hours as needed (for pain scale < 4). ?  ?ferrous sulfate 325 (65 FE) MG EC tablet ?Take 325 mg by  mouth daily. ?  ?ibuprofen 600 MG tablet ?Commonly known as: ADVIL ?Take 1 tablet (600 mg total) by mouth every 6 (six) hours as needed. ?  ?prenatal multivitamin Tabs tablet ?Take 1 tablet by mouth daily at 12 noon. ?  ?sertraline 50 MG tablet ?Commonly known as: ZOLOFT ?Take 50 mg by mouth daily. ?  ? ?  ? ? ? ?Discharge home in stable condition ?Infant Feeding: Breast ?Infant Disposition:home with mother ?Discharge instruction: per After Visit Summary and Postpartum booklet. ?Activity: Advance as tolerated. Pelvic rest for 6 weeks.  ?Diet: routine diet ?Anticipated Birth Control: Unsure ?Postpartum Appointment:6 weeks ?Additional Postpartum F/U:  NA ?Future Appointments:No future appointments. ?Follow up Visit: ? Follow-up Information   ? ? Christophe Louis, MD. Schedule an appointment as soon as possible for a visit in 2 week(s).   ?Specialty: Obstetrics and Gynecology ?Why: make an appt in 2 weeks to evaluate for postpartum depression ?Contact information: ?[address] ?[address] ?[place] [address] ?[phone] ? ? ?  ?  ? ?  ?  ? ?  ? ? ? ?  ? ?01/31/2022 ?Christophe Louis, MD ? ? ?

## 2022-01-31 NOTE — Lactation Note (Signed)
This note was copied from a baby's chart. ?Lactation Consultation Note ? ?Patient Name: Lisa Benton ?Today's Date: 01/31/2022 ?Reason for consult: Follow-up assessment;Mother's request;Nipple pain/trauma;Term;Breastfeeding assistance ?Age:29 hours ?Mom some nipple trauma left more than right. Mom using EBM and coconut oil for nipple care. Nipples intact with compression stripes and some bruising noted on left only.  ? ?LC assisted working on getting a deeper latch and with breast compression noting signs of milk transfer.  ? ?Infant adequate urine and stool output. Mom denied any pain with latch during visit.  ?Maternal Data ?Has patient been taught Hand Expression?: Yes ? ?Feeding ?Mother's Current Feeding Choice: Breast Milk ? ?LATCH Score ?Latch: Repeated attempts needed to sustain latch, nipple held in mouth throughout feeding, stimulation needed to elicit sucking reflex. ? ?Audible Swallowing: Spontaneous and intermittent ? ?Type of Nipple: Everted at rest and after stimulation ? ?Comfort (Breast/Nipple): Soft / non-tender ? ?Hold (Positioning): Assistance needed to correctly position infant at breast and maintain latch. ? ?LATCH Score: 8 ? ? ?Lactation Tools Discussed/Used ?Tools: Coconut oil (Coconut oil for nipple prior along with EBM. Nipples brusing and left abrasion but otherwise intact.) ? ?Interventions ?Interventions: Breast feeding basics reviewed;Assisted with latch;Skin to skin;Breast massage;Hand express;Breast compression;Adjust position;Support pillows;Expressed milk;Position options;Coconut oil;Education;Pace feeding;LC Psychologist, educational;Infant Driven Feeding Algorithm education ? ?Discharge ?Discharge Education: Engorgement and breast care;Warning signs for feeding baby;Outpatient recommendation;Outpatient Epic message sent ?Pump: Personal ? ?Consult Status ?Consult Status: Complete ?Date: 01/31/22 ? ? ? ?Nikiya Starn  Nicholson-Springer ?01/31/2022, 5:30 PM ? ? ? ?

## 2022-01-31 NOTE — Lactation Note (Signed)
This note was copied from a baby's chart. ?Lactation Consultation Note ?Mom needed latch assistance d/t baby acting as if she suddenly couldn't find the nipple. ?LC re-positioned baby into football hold and baby latched well and feeding well. ?Discussed body alignment, support, positions. ?Answered questions mom had. ? ?Patient Name: Lisa Benton ?Today's Date: 01/31/2022 ?Reason for consult: Mother's request;Term ?Age:30 hours ? ?Maternal Data ?  ? ?Feeding ?  ? ?LATCH Score ?Latch: Grasps breast easily, tongue down, lips flanged, rhythmical sucking. ? ?Audible Swallowing: A few with stimulation ? ?Type of Nipple: Everted at rest and after stimulation ? ?Comfort (Breast/Nipple): Soft / non-tender ? ?Hold (Positioning): Assistance needed to correctly position infant at breast and maintain latch. ? ?LATCH Score: 8 ? ? ?Lactation Tools Discussed/Used ?  ? ?Interventions ?Interventions: Breast feeding basics reviewed;Assisted with latch;Skin to skin;Breast massage;Breast compression;Adjust position;Support pillows;Position options ? ?Discharge ?  ? ?Consult Status ?Consult Status: Follow-up ?Date: 01/31/22 ?Follow-up type: In-patient ? ? ? ?Charyl Dancer ?01/31/2022, 4:09 AM ? ? ? ?

## 2022-01-31 NOTE — Social Work (Signed)
CSW received consult for hx of Anxiety and Postpartum Depression.  CSW met with MOB to offer support and complete assessment.   ? ?CSW met with MOB at bedside and introduced CSW role. CSW observed MOB breastfeeding and bonding with the infant. FOB was present at bedside. MOB gave CSW permission to share all information in front of FOB. MOB presented pleasant and welcomed CSW visit. CSW inquired how MOB has felt since giving birth. MOB reported feeling "good" in comparison to her first birthing experience, the epidural worked this time and she pushed for about thirty minutes. CSW inquired how MOB felt emotionally during the pregnancy. MOB reported that she "felt fine." CSW inquired about MOB mental health history. 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 MOB shared that she had intrusive thoughts about someone harming the infant, couldn't sleep and was super stressed about the infant's feeding schedule. MOB reported that she and FOB were not aware that she was experiencing PPD/anxiety until she followed up with her physician. MOB reported that she was prescribed Zoloft that she took for about 6-7 months and felt it was very helpful. MOB reported she has already started Zoloft to prepare and get ahead of PPD/anxiety. CSW acknowledged MOB efforts. MOB identified her husband, parents, in-laws as supports.  ? ?CSW provided education regarding the baby blues period vs. perinatal mood disorders, discussed treatment and gave resources for mental health follow up if concerns arise.  CSW recommended MOB complete a self-evaluation during the postpartum time period using the New Mom Checklist from Postpartum Progress and encouraged MOB to contact a medical professional if symptoms are noted at any time. CSW assessed MOB for safety. MOB denied thoughts of harm to self and others.  ? ?CSW provided review of Sudden Infant Death Syndrome (SIDS)  precautions. MOB reported understanding. MOB has chosen [facility] for the infant's follow up care. CSW assessed MOB for additional need. MOB reported no further need.  ? ?CSW identifies no further need for intervention and no barriers to discharge at this time.  ? ?Pansie Guggisberg, MSW, LCSW ?[facility]  ?Clinical Social Worker  ?[phone] ?01/31/2022  12:27 PM  ?

## 2022-01-31 NOTE — Anesthesia Postprocedure Evaluation (Signed)
Anesthesia Post Note ? ?Patient: Lisa Benton ? ?Procedure(s) Performed: AN AD HOC LABOR EPIDURAL ? ?  ? ?Patient location during evaluation: Mother Baby ?Anesthesia Type: Epidural ?Level of consciousness: awake and alert ?Pain management: pain level controlled ?Vital Signs Assessment: post-procedure vital signs reviewed and stable ?Respiratory status: spontaneous breathing, nonlabored ventilation and respiratory function stable ?Cardiovascular status: stable ?Postop Assessment: no headache, no backache, epidural receding, no apparent nausea or vomiting, able to ambulate, adequate PO intake and patient able to bend at knees ?Anesthetic complications: no ? ? ?No notable events documented. ? ?Last Vitals:  ?Vitals:  ? 01/31/22 0240 01/31/22 0650  ?BP: 122/74 124/72  ?Pulse: 72 74  ?Resp: 16 18  ?Temp: 36.8 ?C 36.9 ?C  ?SpO2: 97% 98%  ?  ?Last Pain:  ?Vitals:  ? 01/31/22 1738  ?TempSrc:   ?PainSc: 4   ? ?Pain Goal:   ? ?  ?  ?  ?  ?  ?  ?  ? ?Ulice Bold [name] ? ? ? ? ?

## 2022-02-01 ENCOUNTER — Other Ambulatory Visit (HOSPITAL_COMMUNITY): Payer: Self-pay

## 2022-02-01 LAB — RH IG WORKUP (INCLUDES ABO/RH)
Fetal Screen: NEGATIVE
Gestational Age(Wks): 41
Unit division: 0

## 2022-02-09 ENCOUNTER — Telehealth (HOSPITAL_COMMUNITY): Payer: Self-pay | Admitting: *Deleted

## 2022-02-09 NOTE — Telephone Encounter (Signed)
Left phone voicemail message. ? ?Duffy Rhody, RN 02-09-2022 at 11:44am ?

## 2022-03-21 ENCOUNTER — Other Ambulatory Visit: Payer: Self-pay

## 2022-03-21 ENCOUNTER — Emergency Department (HOSPITAL_COMMUNITY)
Admission: AD | Admit: 2022-03-21 | Discharge: 2022-03-21 | Discharge status: Against Medical Advice | Payer: Managed Care, Other (non HMO) | Attending: Obstetrics and Gynecology | Admitting: Obstetrics and Gynecology

## 2022-03-21 DIAGNOSIS — N939 Abnormal uterine and vaginal bleeding, unspecified: Secondary | ICD-10-CM | POA: Diagnosis present

## 2022-03-21 DIAGNOSIS — M7989 Other specified soft tissue disorders: Secondary | ICD-10-CM | POA: Diagnosis not present

## 2022-03-21 DIAGNOSIS — Z5321 Procedure and treatment not carried out due to patient leaving prior to being seen by health care provider: Secondary | ICD-10-CM | POA: Insufficient documentation

## 2022-03-21 NOTE — MAU Note (Signed)
Lisa Benton is a 31 y.o. at Unknown here in MAU reporting: is 7 wks PP.  The past couple days has noted some swelling in feet and arms, some in her face. Still bleeding, has never had a day off. Has slowed down then picked back up. Has some floaters in her eyes. Pain in RUQ.  Gallbladder was removed in 2021 Husband said her eyes are jaundiced.  Called her OB and was told to come in.  Vag 01/30/22 Pt passed out yesterday, "might have been the sleep thing", was very brief.  Onset of complaint: within the last wk Pain score: 3 Vitals:   03/21/22 1751  BP: 115/80  Pulse: 91  Resp: 18  Temp: 98.8 F (37.1 C)  SpO2: 100%      Lab orders placed from triage:  none

## 2022-03-21 NOTE — ED Notes (Signed)
Patient told registration that she was leaving.

## 2022-03-21 NOTE — MAU Provider Note (Signed)
Event Date/Time   First Provider Initiated Contact with Patient 03/21/22 1802      S Ms. Lisa Benton is a 31 y.o. [id number] patient who presents to MAU today with complaint of possible syncopal episode last night.  The patient is 7 weeks postpartum.  She has been having some right upper quadrant pain, contacted her OB office, who instructed her to come to the hospital for evaluation.   O BP 115/80 (BP Location: Right Arm)   Pulse 91   Temp 98.8 F (37.1 C) (Oral)   Resp 18   Ht 5\' 5"  (1.651 m)   Wt 80.8 kg   SpO2 100%   Breastfeeding Yes Comment: baby is doing great  BMI 29.65 kg/m  Physical Exam Vitals reviewed.  Constitutional:      Appearance: She is well-developed.  Pulmonary:     Effort: Pulmonary effort is normal.  Abdominal:     General: Bowel sounds are normal.  Genitourinary:    Rectum: Normal.  Skin:    Capillary Refill: Capillary refill takes less than 2 seconds.  Neurological:     General: No focal deficit present.     Mental Status: She is alert.     A Medical screening exam complete  P Patient given the option of transfer to [facility] for further evaluation or seek care in outpatient facility of choice. Patient would like to go to main ED.  I spoke with Dr Darl Householder, who accepted pt. Truett Mainland, DO 03/21/2022 6:11 PM

## 2022-04-09 ENCOUNTER — Emergency Department (HOSPITAL_BASED_OUTPATIENT_CLINIC_OR_DEPARTMENT_OTHER)
Admission: EM | Admit: 2022-04-09 | Discharge: 2022-04-09 | Disposition: A | Discharge status: Home / Self Care | Payer: Managed Care, Other (non HMO) | Attending: Emergency Medicine | Admitting: Emergency Medicine

## 2022-04-09 ENCOUNTER — Other Ambulatory Visit: Payer: Self-pay

## 2022-04-09 ENCOUNTER — Emergency Department (HOSPITAL_BASED_OUTPATIENT_CLINIC_OR_DEPARTMENT_OTHER): Payer: Managed Care, Other (non HMO) | Admitting: Radiology

## 2022-04-09 ENCOUNTER — Encounter (HOSPITAL_BASED_OUTPATIENT_CLINIC_OR_DEPARTMENT_OTHER): Payer: Self-pay | Admitting: Emergency Medicine

## 2022-04-09 DIAGNOSIS — R509 Fever, unspecified: Secondary | ICD-10-CM | POA: Diagnosis not present

## 2022-04-09 DIAGNOSIS — R000 Tachycardia, unspecified: Secondary | ICD-10-CM | POA: Diagnosis not present

## 2022-04-09 DIAGNOSIS — N61 Mastitis without abscess: Secondary | ICD-10-CM | POA: Diagnosis not present

## 2022-04-09 DIAGNOSIS — N644 Mastodynia: Secondary | ICD-10-CM | POA: Diagnosis present

## 2022-04-09 LAB — CBC WITH DIFFERENTIAL/PLATELET
Abs Immature Granulocytes: 0.03 10*3/uL (ref 0.00–0.07)
Basophils Absolute: 0 10*3/uL (ref 0.0–0.1)
Basophils Relative: 0 %
Eosinophils Absolute: 0.1 10*3/uL (ref 0.0–0.5)
Eosinophils Relative: 1 %
HCT: 37.4 % (ref 36.0–46.0)
Hemoglobin: 12.4 g/dL (ref 12.0–15.0)
Immature Granulocytes: 0 %
Lymphocytes Relative: 5 %
Lymphs Abs: 0.4 10*3/uL — ABNORMAL LOW (ref 0.7–4.0)
MCH: 28.6 pg (ref 26.0–34.0)
MCHC: 33.2 g/dL (ref 30.0–36.0)
MCV: 86.4 fL (ref 80.0–100.0)
Monocytes Absolute: 0.4 10*3/uL (ref 0.1–1.0)
Monocytes Relative: 6 %
Neutro Abs: 6.5 10*3/uL (ref 1.7–7.7)
Neutrophils Relative %: 88 %
Platelets: 172 10*3/uL (ref 150–400)
RBC: 4.33 MIL/uL (ref 3.87–5.11)
RDW: 13.1 % (ref 11.5–15.5)
WBC: 7.4 10*3/uL (ref 4.0–10.5)
nRBC: 0 % (ref 0.0–0.2)

## 2022-04-09 LAB — URINALYSIS, ROUTINE W REFLEX MICROSCOPIC
Bilirubin Urine: NEGATIVE
Bilirubin Urine: NEGATIVE
Glucose, UA: NEGATIVE mg/dL
Glucose, UA: NEGATIVE mg/dL
Ketones, ur: >80 mg/dL — AB
Nitrite: NEGATIVE
Nitrite: NEGATIVE
Protein, ur: 100 mg/dL — AB
Protein, ur: NEGATIVE mg/dL
Specific Gravity, Urine: 1.033 — ABNORMAL HIGH (ref 1.005–1.030)
Specific Gravity, Urine: 1.005 (ref 1.005–1.030)
pH: 7.5 (ref 5.0–8.0)
pH: 6.5 (ref 5.0–8.0)

## 2022-04-09 LAB — COMPREHENSIVE METABOLIC PANEL
ALT: 22 U/L (ref 0–44)
AST: 14 U/L — ABNORMAL LOW (ref 15–41)
Albumin: 4.4 g/dL (ref 3.5–5.0)
Alkaline Phosphatase: 113 U/L (ref 38–126)
Anion gap: 12 (ref 5–15)
BUN: 9 mg/dL (ref 6–20)
CO2: 24 mmol/L (ref 22–32)
Calcium: 9.4 mg/dL (ref 8.9–10.3)
Chloride: 101 mmol/L (ref 98–111)
Creatinine, Ser: 0.82 mg/dL (ref 0.44–1.00)
GFR, Estimated: >60 mL/min (ref >60)
Glucose, Bld: 109 mg/dL — ABNORMAL HIGH (ref 70–99)
Potassium: 3.9 mmol/L (ref 3.5–5.1)
Sodium: 137 mmol/L (ref 135–145)
Total Bilirubin: 0.7 mg/dL (ref 0.3–1.2)
Total Protein: 8 g/dL (ref 6.5–8.1)

## 2022-04-09 LAB — PROTIME-INR
INR: 1 (ref 0.8–1.2)
Prothrombin Time: 13.5 seconds (ref 11.4–15.2)

## 2022-04-09 LAB — LACTIC ACID, PLASMA: Lactic Acid, Venous: 0.7 mmol/L (ref 0.5–1.9)

## 2022-04-09 LAB — PREGNANCY, URINE: Preg Test, Ur: NEGATIVE

## 2022-04-09 MED ORDER — CLINDAMYCIN PHOSPHATE 900 MG/50ML IV SOLN
900.0000 mg | Freq: Once | INTRAVENOUS | Status: AC
  Administered 2022-04-09: 900 mg via INTRAVENOUS
  Filled 2022-04-09: qty 50

## 2022-04-09 MED ORDER — SODIUM CHLORIDE 0.9 % IV SOLN: INTRAVENOUS | Status: DC | PRN

## 2022-04-09 MED ORDER — CLINDAMYCIN HCL 150 MG PO CAPS: 450.0000 mg | ORAL_CAPSULE | Freq: Three times a day (TID) | ORAL | 0 refills | Status: AC

## 2022-04-09 MED ORDER — ACETAMINOPHEN 325 MG PO TABS
650.0000 mg | ORAL_TABLET | Freq: Once | ORAL | Status: AC | PRN
  Administered 2022-04-09: 650 mg via ORAL
  Filled 2022-04-09: qty 2

## 2022-04-09 MED ORDER — LACTATED RINGERS IV BOLUS
1000.0000 mL | Freq: Once | INTRAVENOUS | Status: AC
  Administered 2022-04-09: 1000 mL via INTRAVENOUS

## 2022-04-09 MED ORDER — IBUPROFEN 400 MG PO TABS
600.0000 mg | ORAL_TABLET | Freq: Once | ORAL | Status: AC
  Administered 2022-04-09: 600 mg via ORAL
  Filled 2022-04-09: qty 1

## 2022-04-09 NOTE — Discharge Instructions (Addendum)
Discontinue your current clindamycin and restart the new prescription.  Follow-up with your primary care doctor within the next few days for recheck.  Return to the emergency room if you have any worsening symptoms.

## 2022-04-09 NOTE — ED Provider Notes (Signed)
[facility] EMERGENCY DEPT Provider Note   CSN: [id number] Arrival date & time: 04/09/22  0177     History  Chief Complaint  Patient presents with   Breast Pain    Lisa Benton is a 31 y.o. female.  Patient is a 31 year old female who presents with worsening mastitis.  She said she has had some redness and pain to her breast over the last 3 days.  She is postpartum and has a 5-month-old child.  Her delivery was on April 23.  She is breast-feeding.  She notes that she has had some increased redness and pain to both of her breast.  She is developed some fevers since yesterday.  She did a telehealth visit and that provider started her on clindamycin 300 mg twice a day.  She is only taken 1 dose.  She attempted to follow-up with her OB/GYN but they apparently do not see her over 6 weeks postpartum.  She has had some fevers up to 103.  She has some diffuse myalgias.  She has a headache.  No rashes.  No urinary symptoms.  No abdominal pain.  She had some nausea and vomiting this morning.  No diarrhea.       Home Medications Prior to Admission medications   Medication Sig Start Date End Date Taking? Authorizing Provider  clindamycin (CLEOCIN) 150 MG capsule Take 3 capsules (450 mg total) by mouth 3 (three) times daily for 7 days. 04/09/22 04/16/22 Yes Rolan Bucco, MD  acetaminophen (TYLENOL) 325 MG tablet Take 2 tablets (650 mg total) by mouth every 4 (four) hours as needed (for pain scale < 4). 01/31/22   Gerald Leitz, MD  ferrous sulfate 325 (65 FE) MG EC tablet Take 325 mg by mouth daily.    [provider]  ibuprofen (ADVIL) 600 MG tablet Take 1 tablet (600 mg total) by mouth every 6 (six) hours as needed. 01/31/22   Gerald Leitz, MD  Prenatal Vit-Fe Fumarate-FA (PRENATAL MULTIVITAMIN) TABS tablet Take 1 tablet by mouth daily at 12 noon.    [provider]  sertraline (ZOLOFT) 50 MG tablet Take 50 mg by mouth daily.    [provider]      Allergies     Other, Penicillins, Shellfish allergy, Cephalexin, Eggs or egg-derived products, and Sulfamethoxazole-trimethoprim    Review of Systems   Review of Systems  Constitutional:  Positive for fatigue and fever. Negative for chills and diaphoresis.  HENT:  Negative for congestion, rhinorrhea and sneezing.   Eyes: Negative.   Respiratory:  Negative for cough, chest tightness and shortness of breath.   Cardiovascular:  Negative for chest pain and leg swelling.  Gastrointestinal:  Negative for abdominal pain, blood in stool, diarrhea, nausea and vomiting.  Genitourinary:  Negative for difficulty urinating, flank pain, frequency and hematuria.  Musculoskeletal:  Positive for myalgias. Negative for arthralgias and back pain.  Skin:  Positive for color change. Negative for rash.       Breast redness  Neurological:  Positive for headaches. Negative for dizziness, speech difficulty, weakness and numbness.    Physical Exam Updated Vital Signs BP 100/66   Pulse 93   Temp 99.4 F (37.4 C) (Oral)   Resp (!) 22   SpO2 99%   Breastfeeding Yes  Physical Exam Constitutional:      Appearance: She is well-developed.  HENT:     Head: Normocephalic and atraumatic.  Eyes:     Pupils: Pupils are equal, round, and reactive to light.  Neck:  Comments: No meningismus Cardiovascular:     Rate and Rhythm: Normal rate and regular rhythm.     Heart sounds: Normal heart sounds.  Pulmonary:     Effort: Pulmonary effort is normal. No respiratory distress.     Breath sounds: Normal breath sounds. No wheezing or rales.  Chest:     Chest wall: No tenderness.  Abdominal:     General: Bowel sounds are normal.     Palpations: Abdomen is soft.     Tenderness: There is no abdominal tenderness. There is no guarding or rebound.  Musculoskeletal:        General: Normal range of motion.     Cervical back: Normal range of motion and neck supple.  Lymphadenopathy:     Cervical: No cervical adenopathy.  Skin:     General: Skin is warm and dry.     Findings: No rash.     Comments: She has some diffuse tenderness to both of her breast.  She has an area of redness to her right breast on the underside.  She has an area of redness on her left breast in the medial aspect.  There is no induration or fluctuance.  Neurological:     Mental Status: She is alert and oriented to person, place, and time.     ED Results / Procedures / Treatments   Labs (all labs ordered are listed, but only abnormal results are displayed) Labs Reviewed  COMPREHENSIVE METABOLIC PANEL - Abnormal; Notable for the following components:      Result Value   Glucose, Bld 109 (*)    AST 14 (*)    All other components within normal limits  CBC WITH DIFFERENTIAL/PLATELET - Abnormal; Notable for the following components:   Lymphs Abs 0.4 (*)    All other components within normal limits  URINALYSIS, ROUTINE W REFLEX MICROSCOPIC - Abnormal; Notable for the following components:   Specific Gravity, Urine 1.033 (*)    Hgb urine dipstick TRACE (*)    Ketones, ur >80 (*)    Protein, ur 100 (*)    Leukocytes,Ua MODERATE (*)    Bacteria, UA RARE (*)    All other components within normal limits  URINALYSIS, ROUTINE W REFLEX MICROSCOPIC - Abnormal; Notable for the following components:   Color, Urine COLORLESS (*)    Hgb urine dipstick TRACE (*)    Ketones, ur TRACE (*)    Leukocytes,Ua TRACE (*)    All other components within normal limits  CULTURE, BLOOD (ROUTINE X 2)  CULTURE, BLOOD (ROUTINE X 2)  LACTIC ACID, PLASMA  PROTIME-INR  PREGNANCY, URINE    EKG None  Radiology DG Chest Port 1 View  Result Date: 04/09/2022 CLINICAL DATA:  2 weeks postpartum.  Fever and chills.  Sepsis. EXAM: PORTABLE CHEST 1 VIEW COMPARISON:  None Available. FINDINGS: The heart size and mediastinal contours are within normal limits. Both lungs are clear. The visualized skeletal structures are unremarkable. IMPRESSION: No active disease. Electronically  Signed   By: Danae Orleans M.D.   On: 04/09/2022 10:17    Procedures Procedures    Medications Ordered in ED Medications  0.9 %  sodium chloride infusion (0 mLs Intravenous Stopped 04/09/22 1251)  acetaminophen (TYLENOL) tablet 650 mg (650 mg Oral Given 04/09/22 1029)  clindamycin (CLEOCIN) IVPB 900 mg (0 mg Intravenous Stopped 04/09/22 1251)  lactated ringers bolus 1,000 mL ( Intravenous Infusion Verify 04/09/22 1416)  ibuprofen (ADVIL) tablet 600 mg (600 mg Oral Given 04/09/22 1328)  ED Course/ Medical Decision Making/ A&P                           Medical Decision Making Problems Addressed: Mastitis: acute illness or injury with systemic symptoms that poses a threat to life or bodily functions  Amount and/or Complexity of Data Reviewed External Data Reviewed: notes.    Details: Postpartum records Labs: ordered. Decision-making details documented in ED Course. Radiology: ordered and independent interpretation performed. Decision-making details documented in ED Course.  Risk OTC drugs. Prescription drug management. Decision regarding hospitalization.   Patient is a 31 year old who presents with presumed mastitis.  She was febrile on arrival and tachycardic.  She was given IV fluids and started on IV clindamycin.  Her labs show a normal white count.  Her lactate is normal.  Blood cultures were obtained.  Her electrolytes and kidney function were normal.  Her initial urinalysis appeared to be a dirty specimen but repeat showed no suggestions of infection.  She does not have any abdominal pain or signs of intra-abdominal infection.  Chest x-ray was performed and shows no evidence of pneumonia.  This was interpreted by me.  She is feeling much better after treatment in the ED.  Her heart rate is normalized.  Given that she has a normal white count and normal lactate, I feel that she can have an outpatient trial of antibiotics.  She was prescribed clindamycin 300 mg twice daily.  I will increase  the dose to 450 mg 3 times daily.  She was advised to stop the original prescription and restarted.  She was advised on warm compresses and massaging of the breast.  She will continue to either breast-feed or pump.  She was given lactation precautions with the clindamycin and that it may cause diarrhea and the infant.  She will watch out for this.  She was encouraged to follow-up with her primary care doctor who is with [facility] family physicians within the next few days.  Strict return precautions were given.  Final Clinical Impression(s) / ED Diagnoses Final diagnoses:  Mastitis    Rx / DC Orders ED Discharge Orders          Ordered    clindamycin (CLEOCIN) 150 MG capsule  3 times daily        04/09/22 1456              Rolan Bucco, MD 04/09/22 1502

## 2022-04-09 NOTE — ED Triage Notes (Signed)
Pt is 2 weeks post partum , is breast feeding. She had had sore,inflamed breasts for a week . Ob dx mastitis and started clindamycin yesterday. Fever 101 at home and chills. Now 103.3,had tylenol and ibuprofen at 0630

## 2022-04-09 NOTE — ED Triage Notes (Signed)
Pt has extra breast milk frozen incase needs stronger antibiotic.

## 2022-04-09 NOTE — ED Triage Notes (Signed)
Neck is hurting couple days and lower back as well.

## 2022-04-14 LAB — CULTURE, BLOOD (ROUTINE X 2)
Culture: NO GROWTH
Culture: NO GROWTH
Special Requests: ADEQUATE

## 2022-04-29 ENCOUNTER — Other Ambulatory Visit (HOSPITAL_COMMUNITY): Payer: Self-pay

## 2023-08-11 ENCOUNTER — Encounter (HOSPITAL_COMMUNITY): Payer: Self-pay

## 2023-08-11 ENCOUNTER — Ambulatory Visit (HOSPITAL_COMMUNITY)
Admission: EM | Admit: 2023-08-11 | Discharge: 2023-08-11 | Disposition: A | Discharge status: Home / Self Care | Payer: 59

## 2023-08-11 DIAGNOSIS — R040 Epistaxis: Secondary | ICD-10-CM | POA: Diagnosis not present

## 2023-08-11 DIAGNOSIS — R030 Elevated blood-pressure reading, without diagnosis of hypertension: Secondary | ICD-10-CM | POA: Diagnosis not present

## 2023-08-11 MED ORDER — HYDROCHLOROTHIAZIDE 12.5 MG PO TABS: 12.5000 mg | ORAL_TABLET | Freq: Every morning | ORAL | 0 refills | Status: AC

## 2023-08-11 NOTE — Discharge Instructions (Addendum)
To treat your elevated blood pressure, which is likely causing you to have dizziness and a nosebleed, please begin taking hydrochlorothiazide 1 tablet every morning until you are seen by your primary care provider for further evaluation of high blood pressure.  For nosebleeds, I recommend Afrin nasal spray that can be purchased over-the-counter.  Spray 1 spray into the side of her nose that is bleeding, pinch your nose and lean forward for 5 minutes.  If bleeding continues, please apply another spray of Afrin nasal spray.  Thank you for visiting [facility] today.

## 2023-08-11 NOTE — ED Provider Notes (Signed)
[facility]    CSN: [id number] Arrival date & time: 08/11/23  1226    HISTORY   Chief Complaint  Patient presents with   Chest Pain   HPI Lisa Benton is a pleasant, 32 y.o. female who presents to urgent care today. Patient here today with c/o left side chest pain radiating down left arm, lightheadedness, sweats, and nose bleeds that started this morning. Patient states that she feels a little bit better that she did. Heartbeat felt like it was beating irregularly. She was also having some blurry vision. Denies h/o heart condition.  With the exception of a slightly elevated blood pressure, vital signs are completely normal on arrival today.  The history is provided by the patient.   Past Medical History:  Diagnosis Date   Chronic kidney disease    Kidney stones   Gall stones    Headache    Migraines   History of kidney stones    PONV (postoperative nausea and vomiting)    Thoracic outlet syndrome    Patient Active Problem List   Diagnosis Date Noted   Post term pregnancy 01/29/2022   Cholelithiasis 08/22/2020   Term pregnancy 08/19/2020   History of kidney stones 04/03/2014   Hx of migraine headaches 04/03/2014   Past Surgical History:  Procedure Laterality Date   CHOLECYSTECTOMY N/A 09/07/2020   Procedure: LAPAROSCOPIC CHOLECYSTECTOMY WITH INTRAOPERATIVE CHOLANGIOGRAM;  Surgeon: Griselda Miner, MD;  Location: [facility] OR;  Service: General;  Laterality: N/A;   FOOT SURGERY Left    KIDNEY STONE SURGERY     WISDOM TOOTH EXTRACTION     OB History     Gravida  4   Para  2   Term  2   Preterm      AB  2   Living  2      SAB  2   IAB      Ectopic      Multiple  0   Live Births  2          Home Medications    Prior to Admission medications   Medication Sig Start Date End Date Taking? Authorizing Provider  buPROPion (WELLBUTRIN SR) 150 MG 12 hr tablet Take 150 mg by mouth daily.   Yes [provider]    Family History Family  History  Problem Relation Age of Onset   Anxiety disorder Mother    Depression Mother    Mental illness Mother    Diabetes Father    Cancer Maternal Grandfather    Social History Social History   Tobacco Use   Smoking status: Never   Smokeless tobacco: Never  Vaping Use   Vaping status: Never Used  Substance Use Topics   Alcohol use: Not Currently   Drug use: Never   Allergies   Other, Penicillins, Shellfish allergy, Cephalexin, Egg-derived products, and Sulfamethoxazole-trimethoprim  Review of Systems Review of Systems Pertinent findings revealed after performing a 14 point review of systems has been noted in the history of present illness.  Physical Exam Vital Signs BP (!) 134/96 (BP Location: Right Arm)   Pulse 83   Temp 98.9 F (37.2 C) (Oral)   Resp 16   Ht 5\' 5"  (1.651 m)   Wt 180 lb (81.6 kg)   LMP 07/17/2023 (Exact Date)   SpO2 100%   Breastfeeding No   BMI 29.95 kg/m   No data found.  Physical Exam Vitals and nursing note reviewed.  Constitutional:  General: She is not in acute distress.    Appearance: Normal appearance.  HENT:     Head: Normocephalic and atraumatic.  Eyes:     Pupils: Pupils are equal, round, and reactive to light.  Cardiovascular:     Rate and Rhythm: Normal rate and regular rhythm.  Pulmonary:     Effort: Pulmonary effort is normal.     Breath sounds: Normal breath sounds.  Musculoskeletal:        General: Normal range of motion.     Cervical back: Normal range of motion and neck supple.  Skin:    General: Skin is warm and dry.  Neurological:     General: No focal deficit present.     Mental Status: She is alert and oriented to person, place, and time. Mental status is at baseline.  Psychiatric:        Mood and Affect: Mood normal.        Behavior: Behavior normal.        Thought Content: Thought content normal.        Judgment: Judgment normal.     Visual Acuity Right Eye Distance:   Left Eye Distance:    Bilateral Distance:    Right Eye Near:   Left Eye Near:    Bilateral Near:     UC Couse / Diagnostics / Procedures:     Radiology No results found.  Procedures Procedures (including critical care time) EKG  Pending results:  Labs Reviewed - No data to display  Medications Ordered in UC: Medications - No data to display  UC Diagnoses / Final Clinical Impressions(s)   I have reviewed the triage vital signs and the nursing notes.  Pertinent labs & imaging results that were available during my care of the patient were reviewed by me and considered in my medical decision making (see chart for details).    Final diagnoses:  Elevated blood pressure reading in office without diagnosis of hypertension  Epistaxis   Patient advised nosebleed likely related to elevated blood pressure reading on arrival.  Patient provided with a prescription for hydrochlorothiazide to take once daily until she is seen by her PCP.  Recommend Afrin nasal spray to resolve nosebleed.  Please see discharge instructions below for details of plan of care as provided to patient. ED Prescriptions     Medication Sig Dispense Auth. Provider   hydrochlorothiazide (HYDRODIURIL) 12.5 MG tablet Take 1 tablet (12.5 mg total) by mouth in the morning. 90 tablet Theadora Rama Scales, [place]      PDMP not reviewed this encounter.  Pending results:  Labs Reviewed - No data to display  Discharge Instructions:   Discharge Instructions      To treat your elevated blood pressure, which is likely causing you to have dizziness and a nosebleed, please begin taking hydrochlorothiazide 1 tablet every morning until you are seen by your primary care provider for further evaluation of high blood pressure.  For nosebleeds, I recommend Afrin nasal spray that can be purchased over-the-counter.  Spray 1 spray into the side of her nose that is bleeding, pinch your nose and lean forward for 5 minutes.  If bleeding continues,  please apply another spray of Afrin nasal spray.  Thank you for visiting [facility] today.      Disposition Upon Discharge:  Condition: stable for discharge home  Patient presented with an acute illness with associated systemic symptoms and significant discomfort requiring urgent management. In my opinion, this is a  condition that a prudent lay person (someone who possesses an average knowledge of health and medicine) may potentially expect to result in complications if not addressed urgently such as respiratory distress, impairment of bodily function or dysfunction of bodily organs.   Routine symptom specific, illness specific and/or disease specific instructions were discussed with the patient and/or caregiver at length.   As such, the patient has been evaluated and assessed, work-up was performed and treatment was provided in alignment with urgent care protocols and evidence based medicine.  Patient/parent/caregiver has been advised that the patient may require follow up for further testing and treatment if the symptoms continue in spite of treatment, as clinically indicated and appropriate.  Patient/parent/caregiver has been advised to return to the [facility] or PCP if no better; to PCP or the Emergency Department if new signs and symptoms develop, or if the current signs or symptoms continue to change or worsen for further workup, evaluation and treatment as clinically indicated and appropriate  The patient will follow up with their current PCP if and as advised. If the patient does not currently have a PCP we will assist them in obtaining one.   The patient may need specialty follow up if the symptoms continue, in spite of conservative treatment and management, for further workup, evaluation, consultation and treatment as clinically indicated and appropriate.  Patient/parent/caregiver verbalized understanding and agreement of plan as discussed.  All questions were addressed during  visit.  Please see discharge instructions below for further details of plan.  This office note has been dictated using Teaching laboratory technician.  Unfortunately, this method of dictation can sometimes lead to typographical or grammatical errors.  I apologize for your inconvenience in advance if this occurs.  Please do not hesitate to reach out to me if clarification is needed.      Theadora Rama Scales, PA-C 08/11/23 1338

## 2023-08-11 NOTE — ED Triage Notes (Signed)
Patient here today with c/o left side chest pain radiating down left arm, lightheadedness, sweats, and nose bleeds that started this morning. Patient states that she feels a little bit better that she did. Heartbeat felt like it was beating irregularly. She was also having some blurry vision. Denies h/o heart condition.

## 2023-12-20 ENCOUNTER — Institutional Professional Consult (permissible substitution): Payer: 59 | Admitting: Plastic Surgery

## 2024-01-04 ENCOUNTER — Institutional Professional Consult (permissible substitution): Admitting: Plastic Surgery

## 2024-01-17 ENCOUNTER — Ambulatory Visit: Admitting: Plastic Surgery

## 2024-01-17 ENCOUNTER — Encounter: Payer: Self-pay | Admitting: Plastic Surgery

## 2024-01-17 DIAGNOSIS — D489 Neoplasm of uncertain behavior, unspecified: Secondary | ICD-10-CM

## 2024-01-17 DIAGNOSIS — Z808 Family history of malignant neoplasm of other organs or systems: Secondary | ICD-10-CM

## 2024-01-17 DIAGNOSIS — L989 Disorder of the skin and subcutaneous tissue, unspecified: Secondary | ICD-10-CM | POA: Diagnosis not present

## 2024-01-17 NOTE — Progress Notes (Signed)
 Referring Provider Pesci, Corin A, FNP No address on file   CC:  Chief Complaint  Patient presents with   Advice Only   Skin Problem      Lisa Benton is an 33 y.o. female.  HPI: Lisa Benton is a 33 year old female who presents today for evaluation of 2 lesions on her face which she would like to have excised.  Patient states that the lesions occurred during her last pregnancy which was in 2023.  The lesions have grown in size and she would like to have them removed for pathologic diagnosis.  She has had multiple benign lesions removed in the past but does have a family history of skin cancer.  Allergies  Allergen Reactions   Other Anaphylaxis and Other (See Comments)    preservatives Stinging or biting creatures (insects, jellyfish)    Penicillins Anaphylaxis   Shellfish Allergy Anaphylaxis and Rash   Cephalexin Rash and Swelling   Egg-Derived Products Nausea And Vomiting and Other (See Comments)    Flu like symptoms- egg beaters okay    Sulfamethoxazole-Trimethoprim Other (See Comments)    Secondary Yeast Infection     Outpatient Encounter Medications as of 01/17/2024  Medication Sig   buPROPion (WELLBUTRIN SR) 150 MG 12 hr tablet Take 150 mg by mouth daily.   hydrochlorothiazide (HYDRODIURIL) 12.5 MG tablet Take 1 tablet (12.5 mg total) by mouth in the morning.   No facility-administered encounter medications on file as of 01/17/2024.     Past Medical History:  Diagnosis Date   Chronic kidney disease    Kidney stones   Gall stones    Headache    Migraines   History of kidney stones    PONV (postoperative nausea and vomiting)    Thoracic outlet syndrome     Past Surgical History:  Procedure Laterality Date   CHOLECYSTECTOMY N/A 09/07/2020   Procedure: LAPAROSCOPIC CHOLECYSTECTOMY WITH INTRAOPERATIVE CHOLANGIOGRAM;  Surgeon: Griselda Miner, MD;  Location: [facility] OR;  Service: General;  Laterality: N/A;   FOOT SURGERY Left    KIDNEY STONE SURGERY     WISDOM TOOTH  EXTRACTION      Family History  Problem Relation Age of Onset   Anxiety disorder Mother    Depression Mother    Mental illness Mother    Diabetes Father    Cancer Maternal Grandfather     Social History   Social History Narrative   Not on file     Review of Systems General: Denies fevers, chills, weight loss CV: Denies chest pain, shortness of breath, palpitations Skin: Multiple well-healed scars from previous biopsies and skin lesion removals.  2 lesions on the face which are not tender but have grown in size per the patient's history  Physical Exam    08/11/2023   12:34 PM 04/09/2022    3:00 PM 04/09/2022    2:15 PM  Vitals with BMI  Height 5\' 5"     Weight 180 lbs    BMI 29.95    Systolic 134 98 100  Diastolic 96 48 66  Pulse 83 95 93    General:  No acute distress,  Alert and oriented, Non-Toxic, Normal speech and affect Integument: Patient has 2 lesions on her face which are the areas of concern.  There is 1 on the right nasal sidewall which is approximately 3 mm in size and an additional lesion on the left forehead which is approximately 2 to 3 mm in size.  These are white and firm without  evidence of infection or discharge. Mammogram: Not applicable Assessment/Plan Facial lesions patient has 2 facial lesions which have been present for over a year.  With her history of skin cancer she would like to have them removed and evaluated to ensure there is no malignancy.  I think this is reasonable.  We discussed the procedure including the fact that she will have small scars but I showed her the orientation of the incisions to help hide the scars is much as possible.  She understands that if the lesions return as anything other than benign she will need additional and possibly more extensive procedures.  All questions were answered to her satisfaction.  Photographs were obtained today with her consent.  Will schedule her to have the lesions excised in the office at her  request.  Santiago Glad 01/17/2024, 9:30 AM

## 2024-02-14 ENCOUNTER — Encounter: Payer: Self-pay | Admitting: Plastic Surgery

## 2024-02-14 ENCOUNTER — Ambulatory Visit (INDEPENDENT_AMBULATORY_CARE_PROVIDER_SITE_OTHER): Admitting: Plastic Surgery

## 2024-02-14 DIAGNOSIS — D2239 Melanocytic nevi of other parts of face: Secondary | ICD-10-CM | POA: Diagnosis not present

## 2024-02-14 DIAGNOSIS — L989 Disorder of the skin and subcutaneous tissue, unspecified: Secondary | ICD-10-CM

## 2024-02-14 NOTE — Progress Notes (Signed)
 Procedure Note  Preoperative Dx: Skin lesions x 2, left forehead, right nasal sidewall  Postoperative Dx: Same  Procedure: Excision of skin lesions x 2  Anesthesia: Lidocaine  1% with 1:100,000 epinephrine  and 0.25% Sensorcaine    Indication for Procedure: Removal for pathologic diagnosis  Description of Procedure: Risks and complications were explained to the patient including the possibility of recurrence and the need for additional procedures based on pathology.  Consent was confirmed and the patient understands the risks and benefits.  The potential complications and alternatives were explained and the patient consents.  The patient expressed understanding the option of not having the procedure and the risks of a scar.  Time out was called and all information was confirmed to be correct.    The area was prepped and drapped.  Local anesthetic was injected in the subcutaneous tissues.  After waiting for the local to take affect the left forehead was addressed first.  A curvilinear incision was made around the 2 mm skin lesion and the lesion was excised down to subcutaneous fat.  After obtaining hemostasis, the surgical wound was closed with interrupted 5-0 Prolene sutures.  The surgical wound measured 7 mm.  The right nasal sidewall was addressed second.  Again a curvilinear incision around the lesion which was approximately 4 mm in size.  After excising the lesion the incision was closed with interrupted 5-0 Prolene sutures.  The incision measured approximately 1 cm in length A dressing was applied.  The patient was given instructions on how to care for the area and a follow up appointment.  Dlisa tolerated the procedure well and there were no complications. The specimen was sent to pathology.

## 2024-02-19 LAB — DERMATOLOGY PATHOLOGY

## 2024-02-22 ENCOUNTER — Ambulatory Visit: Admitting: Student

## 2024-02-22 VITALS — BP 122/86 | HR 86

## 2024-02-22 DIAGNOSIS — L989 Disorder of the skin and subcutaneous tissue, unspecified: Secondary | ICD-10-CM

## 2024-02-22 NOTE — Progress Notes (Signed)
 Patient is a 33 year old female who recently underwent excision of a skin lesion to the left forehead and a skin lesion to the right nasal wall with Dr. Carolynne Citron on 02/14/2024.  She is about 1 week out from her procedure.  She presents to the clinic today for postprocedural follow-up.  Both lesions were sent to pathology and both showed melanocytic nevus, intradermal type, irritated.  There is no atypia.  Today, patient reports she is doing well.  She states that her bandage after the procedure was stuck to her procedure site on her nose, but it eventually came off.  She otherwise denies any other issues or concerns.  She denies any fevers or chills.  She denies any drainage.  We discussed the results of her pathology.  She expressed understanding.  On exam, patient is sitting upright in no acute distress.  Incisions are intact with Prolene sutures to the right nose and left forehead.  There is no surrounding erythema or drainage.  Prolene sutures were removed without any difficulty.  Patient tolerated well.  There were no signs of infection on exam.  Recommended that patient apply Vaseline to her incisions daily for the next week or so.  Discussed with her that after that, she may start silicone based scar creams.  Also discussed with her to avoid direct sunlight as this can worsen the scar.  Patient expressed understanding.  Patient to follow-up as needed.  I instructed her to call if she has any questions or concerns about anything.

## 2024-09-04 ENCOUNTER — Ambulatory Visit: Payer: Self-pay

## 2024-09-04 NOTE — Telephone Encounter (Signed)
 Copied from CRM #[id number]. Topic: Clinical - Red Word Triage >> Sep 04, 2024 11:37 AM Rea BROCKS wrote: Red Word that prompted transfer to Nurse Triage: Patient is non-established and thought that [facility] was a walk in clinic.   But, she called in for fever, back pain, and thinks it's a possible spreading infection but she said she doesn't feel sick like flu. Patient has history of kidney stones.   She said doesn't want to go to ER but she is going to go to Urgent Care.  [phone] (M) Reason for Disposition  Health information question, no triage required and triager able to answer question  Answer Assessment - Initial Assessment Questions 1. REASON FOR CALL: What is the main reason for your call? or How can I best help you?     Patient thought [facility] was a walk in clinic, made mistake calling. She is going to UC for symptoms  2. SYMPTOMS : Do you have any symptoms?      Fever, back pain  Protocols used: Information Only Call - No Triage-A-AH

## 2024-10-28 ENCOUNTER — Emergency Department (HOSPITAL_BASED_OUTPATIENT_CLINIC_OR_DEPARTMENT_OTHER)
Admission: EM | Admit: 2024-10-28 | Discharge: 2024-10-28 | Disposition: A | Discharge status: Home / Self Care | Attending: Emergency Medicine | Admitting: Emergency Medicine

## 2024-10-28 ENCOUNTER — Emergency Department (HOSPITAL_BASED_OUTPATIENT_CLINIC_OR_DEPARTMENT_OTHER)

## 2024-10-28 ENCOUNTER — Encounter (HOSPITAL_BASED_OUTPATIENT_CLINIC_OR_DEPARTMENT_OTHER): Payer: Self-pay

## 2024-10-28 ENCOUNTER — Other Ambulatory Visit: Payer: Self-pay

## 2024-10-28 DIAGNOSIS — R1013 Epigastric pain: Secondary | ICD-10-CM | POA: Diagnosis not present

## 2024-10-28 DIAGNOSIS — R1011 Right upper quadrant pain: Secondary | ICD-10-CM | POA: Insufficient documentation

## 2024-10-28 DIAGNOSIS — Z79899 Other long term (current) drug therapy: Secondary | ICD-10-CM | POA: Insufficient documentation

## 2024-10-28 DIAGNOSIS — R509 Fever, unspecified: Secondary | ICD-10-CM | POA: Diagnosis not present

## 2024-10-28 DIAGNOSIS — R110 Nausea: Secondary | ICD-10-CM | POA: Insufficient documentation

## 2024-10-28 LAB — COMPREHENSIVE METABOLIC PANEL WITH GFR
ALT: 14 U/L (ref 0–44)
AST: 15 U/L (ref 15–41)
Albumin: 4.6 g/dL (ref 3.5–5.0)
Alkaline Phosphatase: 80 U/L (ref 38–126)
Anion gap: 11 (ref 5–15)
BUN: 8 mg/dL (ref 6–20)
CO2: 25 mmol/L (ref 22–32)
Calcium: 9.7 mg/dL (ref 8.9–10.3)
Chloride: 102 mmol/L (ref 98–111)
Creatinine, Ser: 0.75 mg/dL (ref 0.44–1.00)
GFR, Estimated: >60 mL/min
Glucose, Bld: 106 mg/dL — ABNORMAL HIGH (ref 70–99)
Potassium: 4.1 mmol/L (ref 3.5–5.1)
Sodium: 138 mmol/L (ref 135–145)
Total Bilirubin: 0.5 mg/dL (ref 0.0–1.2)
Total Protein: 7.8 g/dL (ref 6.5–8.1)

## 2024-10-28 LAB — URINALYSIS, ROUTINE W REFLEX MICROSCOPIC
Bilirubin Urine: NEGATIVE
Glucose, UA: NEGATIVE mg/dL
Hgb urine dipstick: NEGATIVE
Ketones, ur: NEGATIVE mg/dL
Leukocytes,Ua: NEGATIVE
Nitrite: NEGATIVE
Protein, ur: NEGATIVE mg/dL
Specific Gravity, Urine: 1.012 (ref 1.005–1.030)
pH: 8 (ref 5.0–8.0)

## 2024-10-28 LAB — CBC
HCT: 41.4 % (ref 36.0–46.0)
Hemoglobin: 13.8 g/dL (ref 12.0–15.0)
MCH: 28.7 pg (ref 26.0–34.0)
MCHC: 33.3 g/dL (ref 30.0–36.0)
MCV: 86.1 fL (ref 80.0–100.0)
Platelets: 204 K/uL (ref 150–400)
RBC: 4.81 MIL/uL (ref 3.87–5.11)
RDW: 12.4 % (ref 11.5–15.5)
WBC: 5.3 K/uL (ref 4.0–10.5)
nRBC: 0 % (ref 0.0–0.2)

## 2024-10-28 LAB — PREGNANCY, URINE: Preg Test, Ur: NEGATIVE

## 2024-10-28 LAB — LIPASE, BLOOD: Lipase: 19 U/L (ref 11–51)

## 2024-10-28 MED ORDER — PANTOPRAZOLE SODIUM 20 MG PO TBEC: 20.0000 mg | DELAYED_RELEASE_TABLET | Freq: Every day | ORAL | 1 refills | Status: AC

## 2024-10-28 MED ORDER — IOHEXOL 300 MG/ML  SOLN
100.0000 mL | Freq: Once | INTRAMUSCULAR | Status: AC | PRN
  Administered 2024-10-28: 100 mL via INTRAVENOUS

## 2024-10-28 NOTE — ED Triage Notes (Signed)
 Pt presents c/o RUQ pain that radiates to back. Reports the pain woke her out of her sleep at 0200. Also reports fever, nausea, 'bitter taste in mouth'. Hx cholecystectomy.

## 2024-10-28 NOTE — Discharge Instructions (Addendum)
 Lab work and CT scan are reassuring today.  I would recommend following up with a GI doctor, please call to schedule appointment.  In the meantime I would recommend starting Protonix . If you notice foods/specific trigger to symptoms try to avoid. Please return to emergency room with new or worsening symptoms.

## 2024-10-28 NOTE — ED Provider Notes (Signed)
 " [place] EMERGENCY DEPARTMENT AT [facility] Provider Note   CSN: [id number] Arrival date & time: 10/28/24  9177     Patient presents with: Abdominal Pain   Lisa Benton is a 34 y.o. female with past medical history of thoracic outlet syndrome, kidney stones, status postcholecystectomy in 2021 presents to emergency room with complaint of right upper quadrant pain and epigastric pain that seems to radiate to her back.  This is associated with subjective fever, nausea and bitter taste in her mouth.  She denies any chest pain shortness of breath or cough.  No swelling in lower extremities. No urinary symptoms.  She reports that this feels very similar to when she had gallstones in the past.    Abdominal Pain      Prior to Admission medications  Medication Sig Start Date End Date Taking? Authorizing Provider  buPROPion (WELLBUTRIN SR) 150 MG 12 hr tablet Take 150 mg by mouth daily.    [provider]  hydrochlorothiazide  (HYDRODIURIL ) 12.5 MG tablet Take 1 tablet (12.5 mg total) by mouth in the morning. 08/11/23 11/09/23  Joesph Shaver Scales, PA-C    Allergies: Other, Penicillins, Shellfish allergy, Cephalexin, Egg protein-containing drug products, and Sulfamethoxazole-trimethoprim    Review of Systems  Gastrointestinal:  Positive for abdominal pain.    Updated Vital Signs BP (!) 132/95 (BP Location: Right Arm)   Pulse 91   Temp 98 F (36.7 C)   Resp 16   LMP 10/06/2024 (Exact Date)   SpO2 98%   Physical Exam Vitals and nursing note reviewed.  Constitutional:      General: She is not in acute distress.    Appearance: She is not toxic-appearing.  HENT:     Head: Normocephalic and atraumatic.  Eyes:     General: No scleral icterus.    Conjunctiva/sclera: Conjunctivae normal.  Cardiovascular:     Rate and Rhythm: Normal rate and regular rhythm.     Pulses: Normal pulses.     Heart sounds: Normal heart sounds.  Pulmonary:     Effort: Pulmonary  effort is normal. No respiratory distress.     Breath sounds: Normal breath sounds.  Abdominal:     General: Abdomen is flat. Bowel sounds are normal.     Palpations: Abdomen is soft.     Tenderness: There is abdominal tenderness in the right upper quadrant and epigastric area. There is no right CVA tenderness or left CVA tenderness.  Skin:    General: Skin is warm and dry.     Findings: No lesion.  Neurological:     General: No focal deficit present.     Mental Status: She is alert and oriented to person, place, and time. Mental status is at baseline.     (all labs ordered are listed, but only abnormal results are displayed) Labs Reviewed  COMPREHENSIVE METABOLIC PANEL WITH GFR - Abnormal; Notable for the following components:      Result Value   Glucose, Bld 106 (*)    All other components within normal limits  LIPASE, BLOOD  CBC  URINALYSIS, ROUTINE W REFLEX MICROSCOPIC  PREGNANCY, URINE    EKG: None  Radiology: CT ABDOMEN PELVIS W CONTRAST Result Date: 10/28/2024 CLINICAL DATA:  Right upper quadrant abdominal pain. EXAM: CT ABDOMEN AND PELVIS WITH CONTRAST TECHNIQUE: Multidetector CT imaging of the abdomen and pelvis was performed using the standard protocol following bolus administration of intravenous contrast. RADIATION DOSE REDUCTION: This exam was performed according to the departmental dose-optimization program which  includes automated exposure control, adjustment of the mA and/or kV according to patient size and/or use of iterative reconstruction technique. CONTRAST:  OMNIPAQUE  IOHEXOL  300 MG/ML  SOLN COMPARISON:  CT abdomen/pelvis dated 10/16/2020. FINDINGS: Lower chest: No acute abnormality. Hepatobiliary: No focal liver abnormality is seen. Status post cholecystectomy. No biliary dilatation. Pancreas: Unremarkable. No pancreatic ductal dilatation or surrounding inflammatory changes. Spleen: Normal in size without focal abnormality. Adrenals/Urinary Tract: The  adrenal glands are unremarkable. Kidneys enhance symmetrically. No hydronephrosis. No renal or ureteral calculi. Bladder is unremarkable. Stomach/Bowel: Stomach is within normal limits. Appendix appears normal. No evidence of bowel wall thickening, distention, or inflammatory changes. Vascular/Lymphatic: Abdominal aorta is normal in caliber. No enlarged abdominal or pelvic lymph nodes. Reproductive: Uterus and bilateral adnexa are within normal limits. Probable involuting corpus luteal cyst in the left ovary. Other: Trace pelvic free fluid is likely physiologic given the patient's age. No intraperitoneal free air. No abdominal wall hernia. Musculoskeletal: No acute or significant osseous findings. IMPRESSION: No acute localizing findings in the abdomen or pelvis. Electronically Signed   By: Harrietta Sherry M.D.   On: 10/28/2024 10:25     Procedures   Medications Ordered in the ED  iohexol  (OMNIPAQUE ) 300 MG/ML solution 100 mL (100 mLs Intravenous Contrast Given 10/28/24 0935)                                    Medical Decision Making Amount and/or Complexity of Data Reviewed Labs: ordered. Radiology: ordered.  Risk Prescription drug management.   This patient presents to the ED for concern of abdominal pain, this involves an extensive number of treatment options, and is a complaint that carries with it a high risk of complications and morbidity.  The differential diagnosis includes cholecystitis, AAA, appendicitis, renal stone, UTI   Co morbidities that complicate the patient evaluation  Thoracic outlet syndrome, kidney stones, status postcholecystectomy in 2021   Additional history obtained:  Additional history obtained from patient had cholecystectomy in 2021   Lab Tests:  I personally interpreted labs.  The pertinent results include:   Urine and negative pregnancy test. CBC, lipase and CMP unremarkable   Imaging Studies ordered:  I ordered imaging studies including CT  abd/pelvis  I independently visualized and interpreted imaging which showed no acute findings. I agree with the radiologist interpretation   Cardiac Monitoring: / EKG:  The patient was maintained on a cardiac monitor.    Problem List / ED Course / Critical interventions / Medication management  Patient presents to emergency room with complaint of right upper quadrant pain and epigastric pain that started last night associated with nausea and decreased appetite.  She has not had any vomiting.  She is passing gas.  She is having normal bowel movements.  On arrival she is hemodynamically stable and well-appearing.  She does have history of having cholecystectomy.  She does have some reproducible tenderness to the right upper quadrant and in the epigastric area.  Her lab work is overall reassuring.  Her urine is negative.  Pregnancy test is negative.  I did obtain CT scan of abdomen pelvis to rule out acute findings, CT scan is negative. I have reviewed the patients home medicines and have made adjustments as needed. Given overall reassuring workup I do feel patient is stable for discharge at this time.  Will give a GI follow-up.  She was given return precautions.  Final diagnoses:  RUQ abdominal pain    ED Discharge Orders          Ordered    pantoprazole  (PROTONIX ) 20 MG tablet  Daily        10/28/24 1045               Milea Klink, Warren SAILOR, PA-C 10/28/24 1047    Jerrol Agent, MD 10/28/24 1119  "
